# Patient Record
Sex: Male | Born: 1945 | Race: White | Hispanic: No | Smoking: Former smoker
Health system: Southern US, Community
[De-identification: ages and names within clinical notes are randomized; demographics above are authoritative.]

## PROBLEM LIST (undated history)

## (undated) DIAGNOSIS — I10 Essential (primary) hypertension: Secondary | ICD-10-CM

## (undated) DIAGNOSIS — T7840XA Allergy, unspecified, initial encounter: Secondary | ICD-10-CM

## (undated) DIAGNOSIS — G43909 Migraine, unspecified, not intractable, without status migrainosus: Secondary | ICD-10-CM

## (undated) DIAGNOSIS — E119 Type 2 diabetes mellitus without complications: Secondary | ICD-10-CM

## (undated) DIAGNOSIS — M1990 Unspecified osteoarthritis, unspecified site: Secondary | ICD-10-CM

## (undated) DIAGNOSIS — R739 Hyperglycemia, unspecified: Secondary | ICD-10-CM

## (undated) DIAGNOSIS — R7611 Nonspecific reaction to tuberculin skin test without active tuberculosis: Secondary | ICD-10-CM

## (undated) DIAGNOSIS — E785 Hyperlipidemia, unspecified: Secondary | ICD-10-CM

## (undated) HISTORY — PX: SHOULDER SURGERY: SHX246

## (undated) HISTORY — DX: Type 2 diabetes mellitus without complications: E11.9

## (undated) HISTORY — DX: Unspecified osteoarthritis, unspecified site: M19.90

## (undated) HISTORY — DX: Hyperglycemia, unspecified: R73.9

## (undated) HISTORY — PX: APPENDECTOMY: SHX54

## (undated) HISTORY — DX: Essential (primary) hypertension: I10

## (undated) HISTORY — PX: TONSILLECTOMY: SUR1361

## (undated) HISTORY — DX: Migraine, unspecified, not intractable, without status migrainosus: G43.909

## (undated) HISTORY — DX: Allergy, unspecified, initial encounter: T78.40XA

## (undated) HISTORY — DX: Hyperlipidemia, unspecified: E78.5

## (undated) HISTORY — DX: Nonspecific reaction to tuberculin skin test without active tuberculosis: R76.11

---

## 2001-03-18 ENCOUNTER — Observation Stay (HOSPITAL_COMMUNITY): Admission: EM | Admit: 2001-03-18 | Discharge: 2001-03-19 | Payer: Self-pay | Admitting: Emergency Medicine

## 2001-03-18 ENCOUNTER — Encounter: Payer: Self-pay | Admitting: Surgery

## 2016-08-18 ENCOUNTER — Encounter: Payer: Self-pay | Admitting: Family Medicine

## 2019-03-06 ENCOUNTER — Encounter: Payer: Self-pay | Admitting: Family Medicine

## 2019-03-06 ENCOUNTER — Ambulatory Visit (INDEPENDENT_AMBULATORY_CARE_PROVIDER_SITE_OTHER): Payer: Medicare Other | Admitting: Family Medicine

## 2019-03-06 ENCOUNTER — Other Ambulatory Visit: Payer: Self-pay

## 2019-03-06 VITALS — BP 116/66 | HR 66 | Temp 98.2°F | Ht 68.0 in | Wt 208.4 lb

## 2019-03-06 DIAGNOSIS — E785 Hyperlipidemia, unspecified: Secondary | ICD-10-CM | POA: Diagnosis not present

## 2019-03-06 DIAGNOSIS — J309 Allergic rhinitis, unspecified: Secondary | ICD-10-CM | POA: Insufficient documentation

## 2019-03-06 DIAGNOSIS — I10 Essential (primary) hypertension: Secondary | ICD-10-CM | POA: Diagnosis not present

## 2019-03-06 DIAGNOSIS — R739 Hyperglycemia, unspecified: Secondary | ICD-10-CM | POA: Diagnosis not present

## 2019-03-06 DIAGNOSIS — M7661 Achilles tendinitis, right leg: Secondary | ICD-10-CM

## 2019-03-06 DIAGNOSIS — Z6831 Body mass index (BMI) 31.0-31.9, adult: Secondary | ICD-10-CM

## 2019-03-06 DIAGNOSIS — E669 Obesity, unspecified: Secondary | ICD-10-CM

## 2019-03-06 DIAGNOSIS — E119 Type 2 diabetes mellitus without complications: Secondary | ICD-10-CM | POA: Insufficient documentation

## 2019-03-06 DIAGNOSIS — M1990 Unspecified osteoarthritis, unspecified site: Secondary | ICD-10-CM | POA: Insufficient documentation

## 2019-03-06 DIAGNOSIS — M7662 Achilles tendinitis, left leg: Secondary | ICD-10-CM

## 2019-03-06 LAB — LIPID PANEL
Cholesterol: 177 mg/dL (ref 0–200)
HDL: 50.9 mg/dL (ref >39.00)
LDL Cholesterol: 100 mg/dL — ABNORMAL HIGH (ref 0–99)
NonHDL: 125.6
Total CHOL/HDL Ratio: 3
Triglycerides: 129 mg/dL (ref 0.0–149.0)
VLDL: 25.8 mg/dL (ref 0.0–40.0)

## 2019-03-06 LAB — CBC
HCT: 41.3 % (ref 39.0–52.0)
Hemoglobin: 14.2 g/dL (ref 13.0–17.0)
MCHC: 34.5 g/dL (ref 30.0–36.0)
MCV: 98.4 fl (ref 78.0–100.0)
Platelets: 228 10*3/uL (ref 150.0–400.0)
RBC: 4.19 Mil/uL — ABNORMAL LOW (ref 4.22–5.81)
RDW: 13.3 % (ref 11.5–15.5)
WBC: 4.8 10*3/uL (ref 4.0–10.5)

## 2019-03-06 LAB — COMPREHENSIVE METABOLIC PANEL
ALT: 26 U/L (ref 0–53)
AST: 17 U/L (ref 0–37)
Albumin: 4.6 g/dL (ref 3.5–5.2)
Alkaline Phosphatase: 51 U/L (ref 39–117)
BUN: 16 mg/dL (ref 6–23)
CO2: 30 mEq/L (ref 19–32)
Calcium: 10.2 mg/dL (ref 8.4–10.5)
Chloride: 101 mEq/L (ref 96–112)
Creatinine, Ser: 1.11 mg/dL (ref 0.40–1.50)
GFR: 64.97 mL/min (ref >60.00)
Glucose, Bld: 153 mg/dL — ABNORMAL HIGH (ref 70–99)
Potassium: 4.1 mEq/L (ref 3.5–5.1)
Sodium: 139 mEq/L (ref 135–145)
Total Bilirubin: 0.6 mg/dL (ref 0.2–1.2)
Total Protein: 6.8 g/dL (ref 6.0–8.3)

## 2019-03-06 LAB — TSH: TSH: 1.79 u[IU]/mL (ref 0.35–4.50)

## 2019-03-06 LAB — HEMOGLOBIN A1C: Hgb A1c MFr Bld: 6.5 % (ref 4.6–6.5)

## 2019-03-06 MED ORDER — LISINOPRIL 20 MG PO TABS: 20.0000 mg | ORAL_TABLET | Freq: Every day | ORAL | 3 refills | Status: DC

## 2019-03-06 MED ORDER — CHLORTHALIDONE 25 MG PO TABS: 25.0000 mg | ORAL_TABLET | Freq: Every day | ORAL | 3 refills | Status: DC

## 2019-03-06 MED ORDER — AMLODIPINE BESYLATE 10 MG PO TABS: 10.0000 mg | ORAL_TABLET | Freq: Every day | ORAL | 3 refills | Status: DC

## 2019-03-06 MED ORDER — SIMVASTATIN 10 MG PO TABS: 10.0000 mg | ORAL_TABLET | Freq: Every day | ORAL | 3 refills | Status: DC

## 2019-03-06 MED ORDER — METOPROLOL SUCCINATE 50 MG PO CS24: 50.0000 mg | EXTENDED_RELEASE_CAPSULE | Freq: Every day | ORAL | 3 refills | Status: DC

## 2019-03-06 NOTE — Assessment & Plan Note (Signed)
At goal.  Continue amlodipine 10 mg daily, chlorthalidone 25 mg daily, lisinopril 20 mg daily, and metoprolol succinate 50 mg daily.  Check CBC, C met, and TSH.  Will place referral to cardiology to establish in the area.

## 2019-03-06 NOTE — Assessment & Plan Note (Signed)
Continue over-the-counter analgesics as needed.  Recommend Voltaren gel as needed.  Offered referral to orthopedics for pain management however he declined.  He will follow-up with me as needed.

## 2019-03-06 NOTE — Patient Instructions (Signed)
It was very nice to see you today!  I will refill your medications.   Please try voltaren gel for your ankle and other aches and pains.  We will check blood work today.  Come back in 1 year, or sooner if needed.   Take care, Dr Jerline Pain  Please try these tips to maintain a healthy lifestyle:   Eat at least 3 REAL meals and 1-2 snacks per day.  Aim for no more than 5 hours between eating.  If you eat breakfast, please do so within one hour of getting up.    Obtain twice as many fruits/vegetables as protein or carbohydrate foods for both lunch and dinner. (Half of each meal should be fruits/vegetables, one quarter protein, and one quarter starchy carbs)   Cut down on sweet beverages. This includes juice, soda, and sweet tea.    Exercise at least 150 minutes every week.

## 2019-03-06 NOTE — Assessment & Plan Note (Signed)
Continue simvastatin 10 mg daily.  Check lipid panel, CBC, C met, and TSH.

## 2019-03-06 NOTE — Assessment & Plan Note (Signed)
Continue Zyrtec as needed 

## 2019-03-06 NOTE — Progress Notes (Signed)
Chief Complaint:  Harold Moore is a 73 y.o. male who presents today with a chief complaint of essential hypertension and to establish care.   Assessment/Plan:  Osteoarthritis Continue over-the-counter analgesics as needed.  Recommend Voltaren gel as needed.  Offered referral to orthopedics for pain management however he declined.  He will follow-up with me as needed.  Allergic rhinitis Continue Zyrtec as needed.  Dyslipidemia Continue simvastatin 10 mg daily.  Check lipid panel, CBC, C met, and TSH.  Hyperglycemia Check A1c, CBC, C met, and TSH.  Essential hypertension At goal.  Continue amlodipine 10 mg daily, chlorthalidone 25 mg daily, lisinopril 20 mg daily, and metoprolol succinate 50 mg daily.  Check CBC, C met, and TSH.  Will place referral to cardiology to establish in the area.  Body mass index is 31.68 kg/m. / Overweight      Subjective:  HPI:  He has some pain in his posterior bilateral ankles.  Pain usually in the morning.  Improves throughout the day.  Sometimes has pain to the area as well.  His stable, chronic medical conditions are outlined below:   # Essential Hypertension - Has seen cardiology in the past in [place] for malignant hypertension in the past and needs to establish with a cardiologist in the area.  Reportedly had stress testing done a few years ago which was normal. - On amlodipine 54m daily, chlorthalidone 25 mg daily, lisinopril 20 mg daily, and metoprolol succinate 50 mg daily.  Tolerating all these well effects. - ROS: No reported chest pain or shortness of breath  # Dyslipidemia - On simvastatin 169mdaily and tolerating well - ROS: No reported myagias  # Allergic Rhinitis - Takes zyrtec 1048maily and tolerates well  # Osteoarthritis / Degenerative Disc Disease - Takes OTC analgesics as needed. Has seen pain management in the past.   ROS: Per HPI, otherwise a complete review of systems was negative.   PMH:  The following were  reviewed and entered/updated in epic: Past Medical History:  Diagnosis Date  . Allergy   . Arthritis   . Hyperlipidemia   . Hypertension   . Migraines   . Positive PPD    Patient Active Problem List   Diagnosis Date Noted  . Essential hypertension 03/06/2019  . Hyperglycemia 03/06/2019  . Dyslipidemia 03/06/2019  . Allergic rhinitis 03/06/2019  . Osteoarthritis 03/06/2019   Past Surgical History:  Procedure Laterality Date  . APPENDECTOMY    . SHOULDER SURGERY    . TONSILLECTOMY      Family History  Problem Relation Age of Onset  . Heart disease Mother   . Stroke Mother   . Heart disease Father   . Prostate cancer Father   . Heart disease Sister   . Thyroid cancer Maternal Grandmother   . Colon cancer Neg Hx     Medications- reviewed and updated Current Outpatient Medications  Medication Sig Dispense Refill  . amLODipine (NORVASC) 10 MG tablet Take 1 tablet (10 mg total) by mouth daily. 90 tablet 3  . aspirin EC 81 MG tablet Take 81 mg by mouth daily.    . cetirizine (ZYRTEC) 10 MG tablet Take 10 mg by mouth daily.    . chlorthalidone (HYGROTON) 25 MG tablet Take 1 tablet (25 mg total) by mouth daily. 90 tablet 3  . lisinopril (ZESTRIL) 20 MG tablet Take 1 tablet (20 mg total) by mouth daily. 90 tablet 3  . Metoprolol Succinate 50 MG CS24 Take 50 mg by mouth  daily. 90 capsule 3  . Probiotic Product (PROBIOTIC PO) Take by mouth.    . senna-docusate (SENOKOT-S) 8.6-50 MG tablet Take 1 tablet by mouth daily.    . simvastatin (ZOCOR) 10 MG tablet Take 1 tablet (10 mg total) by mouth daily. 90 tablet 3   No current facility-administered medications for this visit.     Allergies-reviewed and updated Allergies  Allergen Reactions  . Codeine Anaphylaxis    Social History   Socioeconomic History  . Marital status: Widowed    Spouse name: Not on file  . Number of children: Not on file  . Years of education: Not on file  . Highest education level: Not on file   Occupational History  . Not on file  Social Needs  . Financial resource strain: Not on file  . Food insecurity    Worry: Not on file    Inability: Not on file  . Transportation needs    Medical: Not on file    Non-medical: Not on file  Tobacco Use  . Smoking status: Former Smoker    Types: Cigarettes    Quit date: 03/05/1972    Years since quitting: 47.0  . Smokeless tobacco: Never Used  Substance and Sexual Activity  . Alcohol use: Yes  . Drug use: Never  . Sexual activity: Not on file  Lifestyle  . Physical activity    Days per week: Not on file    Minutes per session: Not on file  . Stress: Not on file  Relationships  . Social Herbalist on phone: Not on file    Gets together: Not on file    Attends religious service: Not on file    Active member of club or organization: Not on file    Attends meetings of clubs or organizations: Not on file    Relationship status: Not on file  Other Topics Concern  . Not on file  Social History Narrative  . Not on file        Objective:  Physical Exam: BP 116/66   Pulse 66   Temp 98.2 F (36.8 C) (Oral)   Ht _0  (1.727 m)   Wt 208 lb 6.1 oz (94.5 kg)   SpO2 99%   BMI 31.68 kg/m   Gen: NAD, resting comfortably CV: Regular rate and rhythm with no murmurs appreciated Pulm: Normal work of breathing, clear to auscultation bilaterally with no crackles, wheezes, or rhonchi GI: Normal bowel sounds present. Soft, Nontender, Nondistended. MSK: No edema, cyanosis, or clubbing noted Skin: Warm, dry Neuro: Grossly normal, moves all extremities Psych: Normal affect and thought content     Dekota Shenk M. Jerline Pain, MD 03/06/2019 10:18 AM

## 2019-03-06 NOTE — Assessment & Plan Note (Signed)
Check A1c, CBC, C met, and TSH. 

## 2019-03-07 ENCOUNTER — Other Ambulatory Visit: Payer: Self-pay

## 2019-03-07 DIAGNOSIS — R7303 Prediabetes: Secondary | ICD-10-CM

## 2019-03-07 MED ORDER — METFORMIN HCL ER 500 MG PO TB24: 500.0000 mg | ORAL_TABLET | Freq: Every day | ORAL | 3 refills | Status: DC

## 2019-03-07 NOTE — Progress Notes (Signed)
Please inform patient of the following:  Cholesterol levels are near goal - recommend continuing current dose of simvastatin. His blood sugar level is borderline diabetic. Recommend starting metformin 500mg  daily to lower blood sugar and reduce risk of heart attack and stroke. Regardless he should continue working on diet and exercise and we can recheck in 6-12 months.   All of his other labs are NORMAL.  Harold Moore. Jerline Pain, MD 03/07/2019 8:03 AM

## 2019-03-21 NOTE — Progress Notes (Signed)
Cardiology Office Note   Date:  03/22/2019   ID:  Harold Canneraul Mcgann, DOB December 24, 1945, MRN [id number]  PCP:  Ardith DarkParker, Caleb M, MD  Cardiologist:   No primary care provider on file. Referring:  Ardith DarkParker, Caleb M, MD  No chief complaint on file.     History of Present Illness: Harold Moore is a 73 y.o. male who is referred by Ardith DarkParker, Caleb M, MD for evaluation of difficult to control HTN.      Has seen cardiology in the past in [place] for malignant hypertension in the past and needs to establish with a cardiologist in the area.  Reportedly had stress testing done a few years ago which was normal.  I do not have records from the previous cardiology office other than to know that he has had carotid Dopplers and echocardiogram and what looks like an aortic ultrasound in the past.  He was told that something on the top of his heart was enlarged.  He denies any cardiovascular symptoms.  He golfs.  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  He denies any cardiovascular symptoms. The patient denies any new symptoms such as chest discomfort, neck or arm discomfort. There has been no new shortness of breath, PND or orthopnea. There have been no reported palpitations, presyncope or syncope.   Past Medical History:  Diagnosis Date  . Allergy   . Arthritis   . Hyperglycemia   . Hyperlipidemia   . Hypertension   . Migraines   . Positive PPD     Past Surgical History:  Procedure Laterality Date  . APPENDECTOMY    . SHOULDER SURGERY    . TONSILLECTOMY       Current Outpatient Medications  Medication Sig Dispense Refill  . amLODipine (NORVASC) 10 MG tablet Take 1 tablet (10 mg total) by mouth daily. 90 tablet 3  . aspirin EC 81 MG tablet Take 81 mg by mouth daily.    . cetirizine (ZYRTEC) 10 MG tablet Take 10 mg by mouth daily.    . chlorthalidone (HYGROTON) 25 MG tablet Take 1 tablet (25 mg total) by mouth daily. 90 tablet 3  . lisinopril (ZESTRIL) 20 MG tablet Take 1 tablet (20 mg  total) by mouth daily. 90 tablet 3  . metFORMIN (GLUCOPHAGE-XR) 500 MG 24 hr tablet Take 1 tablet (500 mg total) by mouth at bedtime. 90 tablet 3  . Metoprolol Succinate 50 MG CS24 Take 50 mg by mouth daily. 90 capsule 3  . Probiotic Product (PROBIOTIC PO) Take by mouth.    . senna-docusate (SENOKOT-S) 8.6-50 MG tablet Take 1 tablet by mouth daily.    . simvastatin (ZOCOR) 10 MG tablet Take 1 tablet (10 mg total) by mouth daily. 90 tablet 3   No current facility-administered medications for this visit.     Allergies:   Codeine    Social History:  The patient  reports that he quit smoking about 47 years ago. His smoking use included cigarettes. He has never used smokeless tobacco. He reports current alcohol use. He reports that he does not use drugs.   Family History:  The patient's family history includes Hearing loss in his brother, sister, and sister; Heart disease in his father, mother, and sister; Hyperlipidemia in his brother, mother, and sister; Hypertension in his mother and sister; Prostate cancer in his father; Stroke in his mother; Thyroid cancer in his maternal grandmother.    ROS:  Please see the history of present illness.  Otherwise, review of systems are positive for none.   All other systems are reviewed and negative.    PHYSICAL EXAM: VS:  BP 110/60   Pulse 65   Temp 98.2 F (36.8 C) (Temporal)   Ht 5\' 8"  (1.727 m)   Wt 208 lb (94.3 kg)   SpO2 98%   BMI 31.63 kg/m  , BMI Body mass index is 31.63 kg/m. GENERAL:  Well appearing HEENT:  Pupils equal round and reactive, fundi not visualized, oral mucosa unremarkable NECK:  No jugular venous distention, waveform within normal limits, carotid upstroke brisk and symmetric, no bruits, no thyromegaly LYMPHATICS:  No cervical, inguinal adenopathy LUNGS:  Clear to auscultation bilaterally BACK:  No CVA tenderness CHEST:  Unremarkable HEART:  PMI not displaced or sustained,S1 and S2 within normal limits, no S3, no S4, no  clicks, no rubs, no murmurs ABD:  Flat, positive bowel sounds normal in frequency in pitch, no bruits, no rebound, no guarding, no midline pulsatile mass, no hepatomegaly, no splenomegaly EXT:  2 plus pulses throughout, no edema, no cyanosis no clubbing SKIN:  No rashes no nodules NEURO:  Cranial nerves II through XII grossly intact, motor grossly intact throughout PSYCH:  Cognitively intact, oriented to person place and time    EKG:  EKG is ordered today. The ekg ordered today demonstrates sinus rhythm, rate 65, axis within normal limits, intervals within normal limits, no acute ST-T wave changes.   Recent Labs: 03/06/2019: ALT 26; BUN 16; Creatinine, Ser 1.11; Hemoglobin 14.2; Platelets 228.0; Potassium 4.1; Sodium 139; TSH 1.79    Lipid Panel    Component Value Date/Time   CHOL 177 03/06/2019 1014   TRIG 129.0 03/06/2019 1014   HDL 50.90 03/06/2019 1014   CHOLHDL 3 03/06/2019 1014   VLDL 25.8 03/06/2019 1014   LDLCALC 100 (H) 03/06/2019 1014      Wt Readings from Last 3 Encounters:  03/22/19 208 lb (94.3 kg)  03/06/19 208 lb 6.1 oz (94.5 kg)      Other studies Reviewed: Additional studies/ records that were reviewed today include: None. Review of the above records demonstrates:  Please see elsewhere in the note.     ASSESSMENT AND PLAN:  HTN:   Blood pressures well controlled on meds as listed.  He will continue.  DYSLIPIDEMIA: His LDL was 100 with an HDL of 50.  Continue meds as listed.  ABNORMAL CARDIOVASCULAR STUDY: I suspect he is describing aortic enlargement from the studies he has had been ordered and the message he was given but I do not have these data.  Again asked him to get me a copy of all of these scans and follow-up accordingly.  Current medicines are reviewed at length with the patient today.  The patient does not have concerns regarding medicines.  The following changes have been made:  no change  Labs/ tests ordered today include: None No  orders of the defined types were placed in this encounter.    Disposition:   FU with me in one year.     Signed, Minus Breeding, MD  03/22/2019 5:31 PM    [place]

## 2019-03-22 ENCOUNTER — Other Ambulatory Visit: Payer: Self-pay

## 2019-03-22 ENCOUNTER — Ambulatory Visit (INDEPENDENT_AMBULATORY_CARE_PROVIDER_SITE_OTHER): Payer: Medicare Other | Admitting: Cardiology

## 2019-03-22 ENCOUNTER — Encounter: Payer: Self-pay | Admitting: Cardiology

## 2019-03-22 VITALS — BP 110/60 | HR 65 | Temp 98.2°F | Ht 68.0 in | Wt 208.0 lb

## 2019-03-22 DIAGNOSIS — E785 Hyperlipidemia, unspecified: Secondary | ICD-10-CM

## 2019-03-22 DIAGNOSIS — I10 Essential (primary) hypertension: Secondary | ICD-10-CM | POA: Diagnosis not present

## 2019-03-22 DIAGNOSIS — R9430 Abnormal result of cardiovascular function study, unspecified: Secondary | ICD-10-CM | POA: Diagnosis not present

## 2019-03-22 NOTE — Patient Instructions (Signed)

## 2019-05-11 ENCOUNTER — Other Ambulatory Visit: Payer: Self-pay | Admitting: Family Medicine

## 2019-05-11 MED ORDER — METOPROLOL SUCCINATE 50 MG PO CS24: 50.0000 mg | EXTENDED_RELEASE_CAPSULE | Freq: Every day | ORAL | 3 refills | Status: DC

## 2019-05-11 NOTE — Telephone Encounter (Signed)
Medication Refill - Medication: Metoprolol Succinate 50 MG CS24  Pt stated [facility] did not receive rx for metoprolol and he is due for refill. Stated they told him they did not have that one for refill. Requesting rx to be resent. Please advise.  Has the patient contacted their pharmacy? Yes.   (Agent: If no, request that the patient contact the pharmacy for the refill.) (Agent: If yes, when and what did the pharmacy advise?)  Preferred Pharmacy (with phone number or street name):  [facility] [place] [facility] [phone] (Phone) [phone] (Fax)     Agent: Please be advised that RX refills may take up to 3 business days. We ask that you follow-up with your pharmacy.

## 2019-05-15 ENCOUNTER — Other Ambulatory Visit: Payer: Self-pay

## 2019-05-15 MED ORDER — METOPROLOL SUCCINATE 50 MG PO CS24: 50.0000 mg | EXTENDED_RELEASE_CAPSULE | Freq: Every day | ORAL | 3 refills | Status: DC

## 2019-05-17 ENCOUNTER — Other Ambulatory Visit: Payer: Self-pay

## 2019-05-17 MED ORDER — METOPROLOL SUCCINATE ER 50 MG PO TB24: 50.0000 mg | ORAL_TABLET | Freq: Every day | ORAL | 3 refills | Status: DC

## 2019-07-04 ENCOUNTER — Ambulatory Visit (INDEPENDENT_AMBULATORY_CARE_PROVIDER_SITE_OTHER): Payer: Medicare Other | Admitting: Physician Assistant

## 2019-07-04 ENCOUNTER — Encounter: Payer: Self-pay | Admitting: Physician Assistant

## 2019-07-04 ENCOUNTER — Other Ambulatory Visit: Payer: Self-pay

## 2019-07-04 VITALS — BP 118/60 | HR 70 | Temp 98.1°F | Ht 68.0 in | Wt 215.2 lb

## 2019-07-04 DIAGNOSIS — H5789 Other specified disorders of eye and adnexa: Secondary | ICD-10-CM | POA: Diagnosis not present

## 2019-07-04 MED ORDER — ERYTHROMYCIN 5 MG/GM OP OINT: 1.0000 "application " | TOPICAL_OINTMENT | Freq: Two times a day (BID) | OPHTHALMIC | 0 refills | Status: DC

## 2019-07-04 NOTE — Progress Notes (Signed)
Harold Moore is a 73 y.o. male here for a new problem.  I acted as a Neurosurgeon for [employer], PA-C Corky Mull, LPN  History of Present Illness:   Chief Complaint  Patient presents with  . Eye Problem    HPI   Eye problem Pt c/o pain in left eye, red, itching and woke up with drainage. Started with pressure above eyeball yesterday. Denies fever, chills, HA, loss of vision, eye floaters, sensation of black curtain coming down in field of vision, loss of peripheral vision, slurred speech, weakness.  Has seen an optometrist in the past year, went to [place], had normal vision exam. Wears glasses but didn't bring them with him today.    Past Medical History:  Diagnosis Date  . Allergy   . Arthritis   . Hyperglycemia   . Hyperlipidemia   . Hypertension   . Migraines   . Positive PPD      Social History   Socioeconomic History  . Marital status: Widowed    Spouse name: Not on file  . Number of children: Not on file  . Years of education: Not on file  . Highest education level: Not on file  Occupational History  . Not on file  Social Needs  . Financial resource strain: Not on file  . Food insecurity    Worry: Not on file    Inability: Not on file  . Transportation needs    Medical: Not on file    Non-medical: Not on file  Tobacco Use  . Smoking status: Former Smoker    Types: Cigarettes    Quit date: 03/05/1972    Years since quitting: 47.3  . Smokeless tobacco: Never Used  Substance and Sexual Activity  . Alcohol use: Yes  . Drug use: Never  . Sexual activity: Not on file  Lifestyle  . Physical activity    Days per week: Not on file    Minutes per session: Not on file  . Stress: Not on file  Relationships  . Social Musician on phone: Not on file    Gets together: Not on file    Attends religious service: Not on file    Active member of club or organization: Not on file    Attends meetings of clubs or organizations: Not on file   Relationship status: Not on file  . Intimate partner violence    Fear of current or ex partner: Not on file    Emotionally abused: Not on file    Physically abused: Not on file    Forced sexual activity: Not on file  Other Topics Concern  . Not on file  Social History Narrative  . Not on file    Past Surgical History:  Procedure Laterality Date  . APPENDECTOMY    . SHOULDER SURGERY    . TONSILLECTOMY      Family History  Problem Relation Age of Onset  . Heart disease Mother   . Stroke Mother   . Hyperlipidemia Mother   . Hypertension Mother   . Heart disease Father   . Prostate cancer Father   . Hearing loss Sister   . Hyperlipidemia Sister   . Hypertension Sister   . Heart disease Sister   . Thyroid cancer Maternal Grandmother   . Hearing loss Sister   . Hearing loss Brother   . Hyperlipidemia Brother   . Colon cancer Neg Hx     Allergies  Allergen Reactions  .  Codeine Anaphylaxis    Current Medications:   Current Outpatient Medications:  .  amLODipine (NORVASC) 10 MG tablet, Take 1 tablet (10 mg total) by mouth daily., Disp: 90 tablet, Rfl: 3 .  aspirin EC 81 MG tablet, Take 81 mg by mouth daily., Disp: , Rfl:  .  cetirizine (ZYRTEC) 10 MG tablet, Take 10 mg by mouth daily., Disp: , Rfl:  .  chlorthalidone (HYGROTON) 25 MG tablet, Take 1 tablet (25 mg total) by mouth daily., Disp: 90 tablet, Rfl: 3 .  lisinopril (ZESTRIL) 20 MG tablet, Take 1 tablet (20 mg total) by mouth daily., Disp: 90 tablet, Rfl: 3 .  metFORMIN (GLUCOPHAGE-XR) 500 MG 24 hr tablet, Take 1 tablet (500 mg total) by mouth at bedtime., Disp: 90 tablet, Rfl: 3 .  metoprolol succinate (TOPROL-XL) 50 MG 24 hr tablet, Take 1 tablet (50 mg total) by mouth daily., Disp: 90 tablet, Rfl: 3 .  Probiotic Product (PROBIOTIC PO), Take by mouth., Disp: , Rfl:  .  senna-docusate (SENOKOT-S) 8.6-50 MG tablet, Take 1 tablet by mouth daily., Disp: , Rfl:  .  simvastatin (ZOCOR) 10 MG tablet, Take 1 tablet (10  mg total) by mouth daily., Disp: 90 tablet, Rfl: 3 .  erythromycin ophthalmic ointment, Place 1 application into the left eye 2 (two) times daily., Disp: 3.5 g, Rfl: 0   Review of Systems:   ROS  Negative unless otherwise specified per HPI.  Vitals:   Vitals:   07/04/19 1302  BP: 118/60  Pulse: 70  Temp: 98.1 F (36.7 C)  TempSrc: Temporal  SpO2: 96%  Weight: 215 lb 4 oz (97.6 kg)  Height: 5\' 8"  (1.727 m)     Body mass index is 32.73 kg/m.  Physical Exam:   Physical Exam Vitals signs and nursing note reviewed.  Constitutional:      Appearance: He is well-developed.  HENT:     Head: Normocephalic.  Eyes:     General: Lids are normal. No visual field deficit.       Left eye: Discharge (watery) present.    Extraocular Movements: Extraocular movements intact.     Conjunctiva/sclera:     Left eye: Left conjunctiva is injected.     Pupils: Pupils are equal, round, and reactive to light.     Comments: Negative fluorescein uptake in L eye;   Neck:     Musculoskeletal: Normal range of motion.  Pulmonary:     Effort: Pulmonary effort is normal.  Musculoskeletal: Normal range of motion.  Skin:    General: Skin is warm and dry.  Neurological:     Mental Status: He is alert and oriented to person, place, and time.  Psychiatric:        Behavior: Behavior normal.        Thought Content: Thought content normal.        Judgment: Judgment normal.     Assessment and Plan:   Harold Moore was seen today for eye problem.  Diagnoses and all orders for this visit:  Irritation of left eye  Other orders -     erythromycin ophthalmic ointment; Place 1 application into the left eye 2 (two) times daily.    No red flags on exam. Will trial e-mycin ointment to cover for possible infection. Strict worsening precautions advised. Discussed that if he is no better, will refer to [facility] urgently Friday of this week. Patient verbalized understanding to plan.  . Reviewed expectations re:  course of current medical issues. . Discussed self-management  of symptoms. . Outlined signs and symptoms indicating need for more acute intervention. . Patient verbalized understanding and all questions were answered. . See orders for this visit as documented in the electronic medical record. . Patient received an After-Visit Summary.  CMA or LPN served as scribe during this visit. History, Physical, and Plan performed by medical provider. The above documentation has been reviewed and is accurate and complete.   Jarold MottoSamantha Jetaime Pinnix, PA-C

## 2019-07-04 NOTE — Patient Instructions (Signed)
It was great to see you!  Start the eye ointment.  If not better in a few days, let me know. We can get you into ophtho on Friday if needed. If any sudden vision loss -- please go to the ER.  Take care,  Inda Coke PA-C

## 2019-07-06 ENCOUNTER — Other Ambulatory Visit: Payer: Self-pay | Admitting: Physician Assistant

## 2019-07-06 ENCOUNTER — Telehealth: Payer: Self-pay | Admitting: Family Medicine

## 2019-07-06 DIAGNOSIS — H5789 Other specified disorders of eye and adnexa: Secondary | ICD-10-CM

## 2019-07-06 NOTE — Telephone Encounter (Signed)
Urgent referral placed. Harold Moore working on referral now and patient should be hearing from her shortly.

## 2019-07-06 NOTE — Telephone Encounter (Signed)
Please see message. °

## 2019-07-06 NOTE — Telephone Encounter (Signed)
Noted  

## 2019-07-06 NOTE — Telephone Encounter (Signed)
Pt called saying his eye was still bothering him. Wants to know if he can been seen again or see an eye doctor. Please advise.

## 2019-09-09 ENCOUNTER — Ambulatory Visit: Payer: Medicare Other | Attending: Internal Medicine

## 2019-09-09 DIAGNOSIS — Z23 Encounter for immunization: Secondary | ICD-10-CM | POA: Insufficient documentation

## 2019-09-09 NOTE — Progress Notes (Signed)
   Covid-19 Vaccination Clinic  Name:  Harold Moore    MRN: [id number] DOB: 1945/11/27  09/09/2019  Harold Moore was observed post Covid-19 immunization for 15 minutes without incidence. He was provided with Vaccine Information Sheet and instruction to access the V-Safe system.   Harold Moore was instructed to call 911 with any severe reactions post vaccine: Marland Kitchen Difficulty breathing  . Swelling of your face and throat  . A fast heartbeat  . A bad rash all over your body  . Dizziness and weakness    Immunizations Administered    Name Date Dose VIS Date Route   Pfizer COVID-19 Vaccine 09/09/2019 12:23 PM 0.3 mL 07/06/2019 Intramuscular   Manufacturer: [employer], [employer]   Lot: [id number]   NDC: [address]-9

## 2019-10-02 ENCOUNTER — Ambulatory Visit: Payer: Medicare Other | Attending: Internal Medicine

## 2019-10-02 DIAGNOSIS — Z23 Encounter for immunization: Secondary | ICD-10-CM | POA: Insufficient documentation

## 2019-10-02 NOTE — Progress Notes (Signed)
   Covid-19 Vaccination Clinic  Name:  Artavius Stearns    MRN: [id number] DOB: 04-09-1946  10/02/2019  Mr. Lacivita was observed post Covid-19 immunization for 15 minutes without incident. He was provided with Vaccine Information Sheet and instruction to access the V-Safe system.   Mr. Asare was instructed to call 911 with any severe reactions post vaccine: Marland Kitchen Difficulty breathing  . Swelling of face and throat  . A fast heartbeat  . A bad rash all over body  . Dizziness and weakness   Immunizations Administered    Name Date Dose VIS Date Route   Pfizer COVID-19 Vaccine 10/02/2019  5:14 PM 0.3 mL 07/06/2019 Intramuscular   Manufacturer: [employer], [employer]   Lot: [id number]   NDC: [address]-2

## 2019-10-17 ENCOUNTER — Telehealth: Payer: Self-pay | Admitting: Family Medicine

## 2019-10-17 NOTE — Telephone Encounter (Signed)
I left a message asking the patient to call and schedule Medicare AWV with Toni Amend [facility]) on 10/19/2019 after seeing Dr. Jimmey Ralph.  I'm waiting for a call back to either confirm or decline the appointment. If patient calls back, please update appointment notes.  VDM (Dee-Dee)

## 2019-10-19 ENCOUNTER — Encounter: Payer: Self-pay | Admitting: Family Medicine

## 2019-10-19 ENCOUNTER — Ambulatory Visit (INDEPENDENT_AMBULATORY_CARE_PROVIDER_SITE_OTHER): Payer: Medicare Other | Admitting: Family Medicine

## 2019-10-19 ENCOUNTER — Other Ambulatory Visit: Payer: Self-pay

## 2019-10-19 ENCOUNTER — Ambulatory Visit (INDEPENDENT_AMBULATORY_CARE_PROVIDER_SITE_OTHER): Payer: Medicare Other

## 2019-10-19 VITALS — BP 118/66 | HR 72 | Temp 97.5°F | Ht 68.0 in | Wt 215.2 lb

## 2019-10-19 VITALS — BP 118/66 | Temp 97.5°F | Ht 68.0 in | Wt 215.2 lb

## 2019-10-19 DIAGNOSIS — J309 Allergic rhinitis, unspecified: Secondary | ICD-10-CM

## 2019-10-19 DIAGNOSIS — R7303 Prediabetes: Secondary | ICD-10-CM | POA: Diagnosis not present

## 2019-10-19 DIAGNOSIS — Z0000 Encounter for general adult medical examination without abnormal findings: Secondary | ICD-10-CM | POA: Diagnosis not present

## 2019-10-19 DIAGNOSIS — R739 Hyperglycemia, unspecified: Secondary | ICD-10-CM | POA: Diagnosis not present

## 2019-10-19 DIAGNOSIS — M1990 Unspecified osteoarthritis, unspecified site: Secondary | ICD-10-CM

## 2019-10-19 DIAGNOSIS — I10 Essential (primary) hypertension: Secondary | ICD-10-CM

## 2019-10-19 LAB — POCT GLYCOSYLATED HEMOGLOBIN (HGB A1C): Hemoglobin A1C: 6.8 % — AB (ref 4.0–5.6)

## 2019-10-19 MED ORDER — TRAMADOL HCL 50 MG PO TABS: 50.0000 mg | ORAL_TABLET | Freq: Three times a day (TID) | ORAL | 0 refills | Status: AC | PRN

## 2019-10-19 MED ORDER — METFORMIN HCL ER 500 MG PO TB24: 1000.0000 mg | ORAL_TABLET | Freq: Every day | ORAL | 3 refills | Status: DC

## 2019-10-19 NOTE — Patient Instructions (Signed)
It was very nice to see you today!  Your blood sugar was went up slightly since our last visit.  Please increase your Metformin to 1000 mg daily.  I will send in a prescription for you.  I will also send in a prescription for tramadol and place referral for you to see the orthopedist.  Please come back in 6 months or so to recheck your blood sugar, or sooner if needed.  Take care, Dr Jimmey Ralph  Please try these tips to maintain a healthy lifestyle:   Eat at least 3 REAL meals and 1-2 snacks per day.  Aim for no more than 5 hours between eating.  If you eat breakfast, please do so within one hour of getting up.    Each meal should contain half fruits/vegetables, one quarter protein, and one quarter carbs (no bigger than a computer mouse)   Cut down on sweet beverages. This includes juice, soda, and sweet tea.     Drink at least 1 glass of water with each meal and aim for at least 8 glasses per day   Exercise at least 150 minutes every week.

## 2019-10-19 NOTE — Assessment & Plan Note (Signed)
A1c 6.8.  Discussed importance of lifestyle modifications.  Will increase Metformin to 1000 mg daily.  Follow-up 6 months to recheck A1c.

## 2019-10-19 NOTE — Assessment & Plan Note (Signed)
Will place referral to orthopedics.  Symptoms are worsening.  We will also send in a few tramadol as this is worked well for him in the past and he is tolerating well without side effects.

## 2019-10-19 NOTE — Patient Instructions (Addendum)
Harold Moore , Thank you for taking time to come for your Medicare Wellness Visit. I appreciate your ongoing commitment to your health goals. Please review the following plan we discussed and let me know if I can assist you in the future.   Screening recommendations/referrals: Colorectal Screening: recommended; Cologuard information provided   Vision and Dental Exams: Recommended annual ophthalmology exams for early detection of glaucoma and other disorders of the eye Recommended annual dental exams for proper oral hygiene  Vaccinations: Influenza vaccine: completed 05/01/19 Pneumococcal vaccine: recommended starting with Prevnar Tdap vaccine: recommended; Please call your insurance company to determine your out of pocket expense. You may receive this vaccine at your local pharmacy or Health Dept. Shingles vaccine: You may receive this vaccine at your local pharmacy. (see handout)   Advanced directives: Please bring a copy of your POA (Power of Attorney) and/or Living Will to your next appointment.  Goals: Recommend to drink at least 6-8 8oz glasses of water per day and consume a balanced diet rich in fresh fruits and vegetables.   Next appointment: Please schedule your Annual Wellness Visit with your Nurse Health Advisor in one year.  Preventive Care 74 Years and Older, Male Preventive care refers to lifestyle choices and visits with your health care provider that can promote health and wellness. What does preventive care include?  A yearly physical exam. This is also called an annual well check.  Dental exams once or twice a year.  Routine eye exams. Ask your health care provider how often you should have your eyes checked.  Personal lifestyle choices, including:  Daily care of your teeth and gums.  Regular physical activity.  Eating a healthy diet.  Avoiding tobacco and drug use.  Limiting alcohol use.  Practicing safe sex.  Taking low doses of aspirin every day if  recommended by your health care provider..  Taking vitamin and mineral supplements as recommended by your health care provider. What happens during an annual well check? The services and screenings done by your health care provider during your annual well check will depend on your age, overall health, lifestyle risk factors, and family history of disease. Counseling  Your health care provider may ask you questions about your:  Alcohol use.  Tobacco use.  Drug use.  Emotional well-being.  Home and relationship well-being.  Sexual activity.  Eating habits.  History of falls.  Memory and ability to understand (cognition).  Work and work Statistician. Screening  You may have the following tests or measurements:  Height, weight, and BMI.  Blood pressure.  Lipid and cholesterol levels. These may be checked every 5 years, or more frequently if you are over 14 years old.  Skin check.  Lung cancer screening. You may have this screening every year starting at age 34 if you have a 30-pack-year history of smoking and currently smoke or have quit within the past 15 years.  Fecal occult blood test (FOBT) of the stool. You may have this test every year starting at age 74.  Flexible sigmoidoscopy or colonoscopy. You may have a sigmoidoscopy every 5 years or a colonoscopy every 10 years starting at age 13.  Prostate cancer screening. Recommendations will vary depending on your family history and other risks.  Hepatitis C blood test.  Hepatitis B blood test.  Sexually transmitted disease (STD) testing.  Diabetes screening. This is done by checking your blood sugar (glucose) after you have not eaten for a while (fasting). You may have this done every 1-3  years.  Abdominal aortic aneurysm (AAA) screening. You may need this if you are a current or former smoker.  Osteoporosis. You may be screened starting at age 44 if you are at high risk. Talk with your health care provider about  your test results, treatment options, and if necessary, the need for more tests. Vaccines  Your health care provider may recommend certain vaccines, such as:  Influenza vaccine. This is recommended every year.  Tetanus, diphtheria, and acellular pertussis (Tdap, Td) vaccine. You may need a Td booster every 10 years.  Zoster vaccine. You may need this after age 74.  Pneumococcal 13-valent conjugate (PCV13) vaccine. One dose is recommended after age 74.  Pneumococcal polysaccharide (PPSV23) vaccine. One dose is recommended after age 74. Talk to your health care provider about which screenings and vaccines you need and how often you need them. This information is not intended to replace advice given to you by your health care provider. Make sure you discuss any questions you have with your health care provider. Document Released: 08/08/2015 Document Revised: 03/31/2016 Document Reviewed: 05/13/2015 Elsevier Interactive Patient Education  2017 [employer].  Fall Prevention in the Home Falls can cause injuries. They can happen to people of all ages. There are many things you can do to make your home safe and to help prevent falls. What can I do on the outside of my home?  Regularly fix the edges of walkways and driveways and fix any cracks.  Remove anything that might make you trip as you walk through a door, such as a raised step or threshold.  Trim any bushes or trees on the path to your home.  Use bright outdoor lighting.  Clear any walking paths of anything that might make someone trip, such as rocks or tools.  Regularly check to see if handrails are loose or broken. Make sure that both sides of any steps have handrails.  Any raised decks and porches should have guardrails on the edges.  Have any leaves, snow, or ice cleared regularly.  Use sand or salt on walking paths during winter.  Clean up any spills in your garage right away. This includes oil or grease spills. What  can I do in the bathroom?  Use night lights.  Install grab bars by the toilet and in the tub and shower. Do not use towel bars as grab bars.  Use non-skid mats or decals in the tub or shower.  If you need to sit down in the shower, use a plastic, non-slip stool.  Keep the floor dry. Clean up any water that spills on the floor as soon as it happens.  Remove soap buildup in the tub or shower regularly.  Attach bath mats securely with double-sided non-slip rug tape.  Do not have throw rugs and other things on the floor that can make you trip. What can I do in the bedroom?  Use night lights.  Make sure that you have a light by your bed that is easy to reach.  Do not use any sheets or blankets that are too big for your bed. They should not hang down onto the floor.  Have a firm chair that has side arms. You can use this for support while you get dressed.  Do not have throw rugs and other things on the floor that can make you trip. What can I do in the kitchen?  Clean up any spills right away.  Avoid walking on wet floors.  Keep items that  you use a lot in easy-to-reach places.  If you need to reach something above you, use a strong step stool that has a grab bar.  Keep electrical cords out of the way.  Do not use floor polish or wax that makes floors slippery. If you must use wax, use non-skid floor wax.  Do not have throw rugs and other things on the floor that can make you trip. What can I do with my stairs?  Do not leave any items on the stairs.  Make sure that there are handrails on both sides of the stairs and use them. Fix handrails that are broken or loose. Make sure that handrails are as long as the stairways.  Check any carpeting to make sure that it is firmly attached to the stairs. Fix any carpet that is loose or worn.  Avoid having throw rugs at the top or bottom of the stairs. If you do have throw rugs, attach them to the floor with carpet tape.  Make sure  that you have a light switch at the top of the stairs and the bottom of the stairs. If you do not have them, ask someone to add them for you. What else can I do to help prevent falls?  Wear shoes that:  Do not have high heels.  Have rubber bottoms.  Are comfortable and fit you well.  Are closed at the toe. Do not wear sandals.  If you use a stepladder:  Make sure that it is fully opened. Do not climb a closed stepladder.  Make sure that both sides of the stepladder are locked into place.  Ask someone to hold it for you, if possible.  Clearly mark and make sure that you can see:  Any grab bars or handrails.  First and last steps.  Where the edge of each step is.  Use tools that help you move around (mobility aids) if they are needed. These include:  Canes.  Walkers.  Scooters.  Crutches.  Turn on the lights when you go into a dark area. Replace any light bulbs as soon as they burn out.  Set up your furniture so you have a clear path. Avoid moving your furniture around.  If any of your floors are uneven, fix them.  If there are any pets around you, be aware of where they are.  Review your medicines with your doctor. Some medicines can make you feel dizzy. This can increase your chance of falling. Ask your doctor what other things that you can do to help prevent falls. This information is not intended to replace advice given to you by your health care provider. Make sure you discuss any questions you have with your health care provider. Document Released: 05/08/2009 Document Revised: 12/18/2015 Document Reviewed: 08/16/2014 Elsevier Interactive Patient Education  2017 [employer].

## 2019-10-19 NOTE — Assessment & Plan Note (Signed)
At goal.  Continue amlodipine 10 mg daily, chlorthalidone 25 mg daily, lisinopril 20 mg daily, and Toprol succinate 50 mg daily.

## 2019-10-19 NOTE — Assessment & Plan Note (Signed)
Stable.  Continue over-the-counter antihistamines as needed. 

## 2019-10-19 NOTE — Progress Notes (Signed)
   Harold Moore is a 74 y.o. male who presents today for an office visit.  Assessment/Plan:  Chronic Problems Addressed Today: Osteoarthritis Will place referral to orthopedics.  Symptoms are worsening.  We will also send in a few tramadol as this is worked well for him in the past and he is tolerating well without side effects.  Allergic rhinitis Stable.  Continue over-the-counter antihistamines as needed.  Hyperglycemia A1c 6.8.  Discussed importance of lifestyle modifications.  Will increase Metformin to 1000 mg daily.  Follow-up 6 months to recheck A1c.  Essential hypertension At goal.  Continue amlodipine 10 mg daily, chlorthalidone 25 mg daily, lisinopril 20 mg daily, and Toprol succinate 50 mg daily.     Subjective:  HPI:  See A/p.       Objective:  Physical Exam: BP 118/66   Pulse 72   Temp (!) 97.5 F (36.4 C)   Ht 5\' 8"  (1.727 m)   Wt 215 lb 3.2 oz (97.6 kg)   SpO2 98%   BMI 32.72 kg/m   Wt Readings from Last 3 Encounters:  10/19/19 215 lb 2.7 oz (97.6 kg)  10/19/19 215 lb 3.2 oz (97.6 kg)  07/04/19 215 lb 4 oz (97.6 kg)  Gen: No acute distress, resting comfortably CV: Regular rate and rhythm with no murmurs appreciated Pulm: Normal work of breathing, clear to auscultation bilaterally with no crackles, wheezes, or rhonchi Neuro: Grossly normal, moves all extremities Psych: Normal affect and thought content      Avrielle Fry M. 14/09/20, MD 10/19/2019 9:45 AM

## 2019-10-19 NOTE — Progress Notes (Signed)
Subjective:   Harold Moore is a 74 y.o. male who presents for an Initial Medicare Annual Wellness Visit.  Review of Systems   Cardiac Risk Factors include: advanced age (>64men, >102 women);hypertension;male gender;dyslipidemia   Objective:    Today's Vitals   10/19/19 0902  BP: 118/66  Temp: (!) 97.5 F (36.4 C)  TempSrc: Temporal  SpO2: 98%  Weight: 215 lb 2.7 oz (97.6 kg)  Height: 5\' 8"  (1.727 m)   Body mass index is 32.72 kg/m.  Advanced Directives 10/19/2019  Does Patient Have a Medical Advance Directive? Yes  Type of Advance Directive Living will;Healthcare Power of Attorney  Does patient want to make changes to medical advance directive? No - Patient declined  Copy of Healthcare Power of Attorney in Chart? No - copy requested    Current Medications (verified) Outpatient Encounter Medications as of 10/19/2019  Medication Sig  . amLODipine (NORVASC) 10 MG tablet Take 1 tablet (10 mg total) by mouth daily.  10/21/2019 aspirin EC 81 MG tablet Take 81 mg by mouth daily.  . cetirizine (ZYRTEC) 10 MG tablet Take 10 mg by mouth daily.  . chlorthalidone (HYGROTON) 25 MG tablet Take 1 tablet (25 mg total) by mouth daily.  Marland Kitchen erythromycin ophthalmic ointment Place 1 application into the left eye 2 (two) times daily.  Marland Kitchen lisinopril (ZESTRIL) 20 MG tablet Take 1 tablet (20 mg total) by mouth daily.  . metFORMIN (GLUCOPHAGE-XR) 500 MG 24 hr tablet Take 1 tablet (500 mg total) by mouth at bedtime.  . metoprolol succinate (TOPROL-XL) 50 MG 24 hr tablet Take 1 tablet (50 mg total) by mouth daily.  . Probiotic Product (PROBIOTIC PO) Take by mouth.  . senna-docusate (SENOKOT-S) 8.6-50 MG tablet Take 1 tablet by mouth daily.  . simvastatin (ZOCOR) 10 MG tablet Take 1 tablet (10 mg total) by mouth daily.   No facility-administered encounter medications on file as of 10/19/2019.    Allergies (verified) Codeine   History: Past Medical History:  Diagnosis Date  . Allergy   . Arthritis   .  Hyperglycemia   . Hyperlipidemia   . Hypertension   . Migraines   . Positive PPD    Past Surgical History:  Procedure Laterality Date  . APPENDECTOMY    . SHOULDER SURGERY    . TONSILLECTOMY     Family History  Problem Relation Age of Onset  . Heart disease Mother   . Stroke Mother   . Hyperlipidemia Mother   . Hypertension Mother   . Heart disease Father   . Prostate cancer Father   . Hearing loss Sister   . Hyperlipidemia Sister   . Hypertension Sister   . Heart disease Sister   . Thyroid cancer Maternal Grandmother   . Hearing loss Sister   . Hearing loss Brother   . Hyperlipidemia Brother   . Colon cancer Neg Hx    Social History   Socioeconomic History  . Marital status: Widowed    Spouse name: Not on file  . Number of children: Not on file  . Years of education: Not on file  . Highest education level: Not on file  Occupational History  . Not on file  Tobacco Use  . Smoking status: Former Smoker    Types: Cigarettes    Quit date: 03/05/1972    Years since quitting: 47.6  . Smokeless tobacco: Never Used  Substance and Sexual Activity  . Alcohol use: Yes  . Drug use: Never  . Sexual  activity: Not on file  Other Topics Concern  . Not on file  Social History Narrative  . Not on file   Social Determinants of Health   Financial Resource Strain:   . Difficulty of Paying Living Expenses:   Food Insecurity:   . Worried About Programme researcher, broadcasting/film/video in the Last Year:   . Barista in the Last Year:   Transportation Needs:   . Freight forwarder (Medical):   Marland Kitchen Lack of Transportation (Non-Medical):   Physical Activity:   . Days of Exercise per Week:   . Minutes of Exercise per Session:   Stress:   . Feeling of Stress :   Social Connections:   . Frequency of Communication with Friends and Family:   . Frequency of Social Gatherings with Friends and Family:   . Attends Religious Services:   . Active Member of Clubs or Organizations:   . Attends  Banker Meetings:   Marland Kitchen Marital Status:    Tobacco Counseling Counseling given: Not Answered   Clinical Intake:  Pre-visit preparation completed: Yes  Diabetes: No  How often do you need to have someone help you when you read instructions, pamphlets, or other written materials from your doctor or pharmacy?: 1 - Never  Interpreter Needed?: No  Information entered by :: Kandis Fantasia LPN  Activities of Daily Living In your present state of health, do you have any difficulty performing the following activities: 10/19/2019 10/19/2019  Hearing? N N  Vision? N N  Difficulty concentrating or making decisions? N N  Walking or climbing stairs? N N  Dressing or bathing? N N  Doing errands, shopping? N N  Preparing Food and eating ? - N  Using the Toilet? - N  In the past six months, have you accidently leaked urine? - N  Do you have problems with loss of bowel control? - N  Managing your Medications? - N  Managing your Finances? - N  Housekeeping or managing your Housekeeping? - N  Some recent data might be hidden     Immunizations and Health Maintenance Immunization History  Administered Date(s) Administered  . Influenza-Unspecified 05/01/2019  . PFIZER SARS-COV-2 Vaccination 09/09/2019, 10/02/2019   Health Maintenance Due  Topic Date Due  . Hepatitis C Screening  Never done    Patient Care Team: Ardith Dark, MD as PCP - General (Family Medicine)  Indicate any recent Medical Services you may have received from other than [facility] providers in the past year (date may be approximate).    Assessment:   This is a routine wellness examination for [place].  Hearing/Vision screen No exam data present  Dietary issues and exercise activities discussed: Current Exercise Habits: The patient does not participate in regular exercise at present  Goals   None    Depression Screen PHQ 2/9 Scores 10/19/2019 03/06/2019  PHQ - 2 Score 0 0    Fall Risk Fall Risk   10/19/2019 10/19/2019 03/06/2019  Falls in the past year? 0 0 1  Number falls in past yr: 0 - 0  Injury with Fall? 0 - 1  Comment - - Fell off ladder,hip injury  Follow up Falls evaluation completed;Education provided;Falls prevention discussed - -    Is the patient's home free of loose throw rugs in walkways, pet beds, electrical cords, etc?   yes      Grab bars in the bathroom? yes      Handrails on the stairs?   yes  Adequate lighting?   yes  Timed Get Up and Go performed: completed and within normal timeframe; no gait abnormalities noted   Cognitive Function:   6CIT Screen 10/19/2019  What Year? 0 points  What month? 0 points  What time? 0 points  Count back from 20 0 points  Months in reverse 0 points  Repeat phrase 0 points  Total Score 0    Screening Tests Health Maintenance  Topic Date Due  . Hepatitis C Screening  Never done  . COLONOSCOPY  10/18/2020 (Originally 05/23/1996)  . TETANUS/TDAP  10/18/2020 (Originally 05/23/1965)  . PNA vac Low Risk Adult (1 of 2 - PCV13) 10/18/2020 (Originally 05/24/2011)  . INFLUENZA VACCINE  Completed    Qualifies for Shingles Vaccine? Discussed and patient will check with pharmacy for coverage.  Patient education handout provided   Cancer Screenings: Lung: Low Dose CT Chest recommended if Age 85-80 years, 30 pack-year currently smoking OR have quit w/in 15years. Patient does not qualify. Colorectal: Cologuard information provided     Plan:  I have personally reviewed and addressed the Medicare Annual Wellness questionnaire and have noted the following in the patient's chart:  A. Medical and social history B. Use of alcohol, tobacco or illicit drugs  C. Current medications and supplements D. Functional ability and status E.  Nutritional status F.  Physical activity G. Advance directives H. List of other physicians I.  Hospitalizations, surgeries, and ER visits in previous 12 months J.  [place] such as  hearing and vision if needed, cognitive and depression L. Referrals, records requested, and appointments- none   In addition, I have reviewed and discussed with patient certain preventive protocols, quality metrics, and best practice recommendations. A written personalized care plan for preventive services as well as general preventive health recommendations were provided to patient.   Signed,  Denman George, LPN  Nurse Health Advisor   Nurse Notes: no additional

## 2019-10-24 ENCOUNTER — Encounter: Payer: Self-pay | Admitting: Physician Assistant

## 2019-10-24 ENCOUNTER — Ambulatory Visit (INDEPENDENT_AMBULATORY_CARE_PROVIDER_SITE_OTHER): Payer: Medicare Other | Admitting: Orthopaedic Surgery

## 2019-10-24 ENCOUNTER — Ambulatory Visit (INDEPENDENT_AMBULATORY_CARE_PROVIDER_SITE_OTHER): Payer: Medicare Other

## 2019-10-24 ENCOUNTER — Other Ambulatory Visit: Payer: Self-pay

## 2019-10-24 DIAGNOSIS — G8929 Other chronic pain: Secondary | ICD-10-CM

## 2019-10-24 DIAGNOSIS — M5416 Radiculopathy, lumbar region: Secondary | ICD-10-CM

## 2019-10-24 MED ORDER — METHOCARBAMOL 500 MG PO TABS: 500.0000 mg | ORAL_TABLET | Freq: Two times a day (BID) | ORAL | 0 refills | Status: DC | PRN

## 2019-10-24 MED ORDER — PREDNISONE 5 MG (21) PO TBPK: ORAL_TABLET | ORAL | 0 refills | Status: DC

## 2019-10-24 NOTE — Progress Notes (Signed)
Office Visit Note   Patient: Harold Moore           Date of Birth: [birth year]           MRN: [id number] Visit Date: 10/24/2019              Requested by: Ardith Dark, MD [address] [place],  [place] [address] PCP: Ardith Dark, MD   Assessment & Plan: Visit Diagnoses:  1. Chronic radicular pain of lower back     Plan: Impression is chronic bilateral lower back pain with right-sided lumbar radiculopathy.  I will start the patient on a steroid taper and muscle relaxer.  We will also get an updated MRI.  He will follow up with [place] after MRI  Follow-Up Instructions: Return if symptoms worsen or fail to improve.   Orders:  Orders Placed This Encounter  Procedures  . XR HIPS BILAT W OR W/O PELVIS 3-4 VIEWS  . XR Lumbar Spine 2-3 Views   Meds ordered this encounter  Medications  . predniSONE (STERAPRED UNI-PAK 21 TAB) 5 MG (21) TBPK tablet    Sig: Take as directed    Dispense:  21 tablet    Refill:  0  . methocarbamol (ROBAXIN) 500 MG tablet    Sig: Take 1 tablet (500 mg total) by mouth 2 (two) times daily as needed.    Dispense:  20 tablet    Refill:  0      Procedures: No procedures performed   Clinical Data: No additional findings.   Subjective: Chief Complaint  Patient presents with  . Right Hip - Pain  . Left Hip - Pain    HPI patient is a pleasant 74 year old gentleman who comes in today with bilateral hip pain.  This began approximately 5 to 6 years ago and has recently worsened.  The pain he has is to the entire lower back.  Radiates to the right lateral hip and down the right leg into the shin.  He also notes left lateral hip pain.  No pain to the anterior thigh or groin.  He does note occasional numbness and tingling to the right foot.  He does have a previous history of epidural steroid injections as well as trochanteric bursa injection to the left hip all with good relief of symptoms.  He denies any lower extremity weakness.  No new bowel or bladder  change or saddle paresthesias.  Review of Systems as detailed in HPI.  All others reviewed and are negative.   Objective: Vital Signs: There were no vitals taken for this visit.  Physical Exam well-developed well-nourished gentleman in no acute distress.  Alert and oriented x3.  Ortho Exam examination of the lumbar spine reveals no spinous tenderness.  He does have bilateral paraspinous musculature tenderness.  No pain with lumbar flexion or extension.  He has a moderately positive straight leg raise on the right.  Negative logroll and negative FADIR.  Negative straight leg raise on the left.  Negative logroll negative FADIR on the left.  Mild tenderness the greater trochanter on the left.  No focal weakness.  He is neurovascular intact distally.  Specialty Comments:  No specialty comments available.  Imaging: XR HIPS BILAT W OR W/O PELVIS 3-4 VIEWS  Result Date: 10/24/2019 Mild degenerative changes  XR Lumbar Spine 2-3 Views  Result Date: 10/24/2019 Moderate spondylosis worse L5-S1    PMFS History: Patient Active Problem List   Diagnosis Date Noted  . Essential hypertension 03/06/2019  .  Hyperglycemia 03/06/2019  . Dyslipidemia 03/06/2019  . Allergic rhinitis 03/06/2019  . Osteoarthritis 03/06/2019   Past Medical History:  Diagnosis Date  . Allergy   . Arthritis   . Hyperglycemia   . Hyperlipidemia   . Hypertension   . Migraines   . Positive PPD     Family History  Problem Relation Age of Onset  . Heart disease Mother   . Stroke Mother   . Hyperlipidemia Mother   . Hypertension Mother   . Heart disease Father   . Prostate cancer Father   . Hearing loss Sister   . Hyperlipidemia Sister   . Hypertension Sister   . Heart disease Sister   . Thyroid cancer Maternal Grandmother   . Hearing loss Sister   . Hearing loss Brother   . Hyperlipidemia Brother   . Colon cancer Neg Hx     Past Surgical History:  Procedure Laterality Date  . APPENDECTOMY    .  SHOULDER SURGERY    . TONSILLECTOMY     Social History   Occupational History  . Not on file  Tobacco Use  . Smoking status: Former Smoker    Types: Cigarettes    Quit date: 03/05/1972    Years since quitting: 47.6  . Smokeless tobacco: Never Used  Substance and Sexual Activity  . Alcohol use: Yes  . Drug use: Never  . Sexual activity: Not on file

## 2019-11-05 ENCOUNTER — Telehealth: Payer: Self-pay | Admitting: Physician Assistant

## 2019-11-05 ENCOUNTER — Other Ambulatory Visit: Payer: Self-pay

## 2019-11-05 MED ORDER — TRAMADOL HCL 50 MG PO TABS: 50.0000 mg | ORAL_TABLET | Freq: Three times a day (TID) | ORAL | 0 refills | Status: DC

## 2019-11-05 NOTE — Telephone Encounter (Signed)
RX called in to pharm. 

## 2019-11-05 NOTE — Telephone Encounter (Signed)
Called into pharm. Patient aware.  

## 2019-11-05 NOTE — Telephone Encounter (Signed)
Patient called. Says the pain meds and muscle relaxer's are not working. His call back number is [phone]

## 2019-11-05 NOTE — Telephone Encounter (Signed)
Patient called. He would like his medication sent to [facility] in [place] on [address]

## 2019-11-05 NOTE — Telephone Encounter (Signed)
I sent in a steroid taper and muscle relaxer I believe.  Can you call in tramadol 50mg  one tab po tid prn pain #30 no refill

## 2019-11-24 ENCOUNTER — Other Ambulatory Visit: Payer: Self-pay

## 2019-11-24 ENCOUNTER — Ambulatory Visit
Admission: RE | Admit: 2019-11-24 | Discharge: 2019-11-24 | Disposition: A | Discharge status: Home / Self Care | Payer: Medicare Other | Source: Ambulatory Visit | Attending: Physician Assistant | Admitting: Physician Assistant

## 2019-11-24 DIAGNOSIS — M5416 Radiculopathy, lumbar region: Secondary | ICD-10-CM

## 2019-11-24 DIAGNOSIS — G8929 Other chronic pain: Secondary | ICD-10-CM

## 2019-11-26 NOTE — Progress Notes (Signed)
F/u to discuss

## 2019-11-27 ENCOUNTER — Other Ambulatory Visit: Payer: Self-pay

## 2019-11-27 ENCOUNTER — Encounter: Payer: Self-pay | Admitting: Orthopaedic Surgery

## 2019-11-27 ENCOUNTER — Ambulatory Visit (INDEPENDENT_AMBULATORY_CARE_PROVIDER_SITE_OTHER): Payer: Medicare Other | Admitting: Orthopaedic Surgery

## 2019-11-27 DIAGNOSIS — M5416 Radiculopathy, lumbar region: Secondary | ICD-10-CM | POA: Diagnosis not present

## 2019-11-27 MED ORDER — TRAMADOL HCL 50 MG PO TABS: ORAL_TABLET | ORAL | 2 refills | Status: DC

## 2019-11-27 NOTE — Progress Notes (Signed)
Office Visit Note   Patient: Harold Moore           Date of Birth: 10-Feb-1946           MRN: [id number] Visit Date: 11/27/2019              Requested by: [name], MD [address] [place],  [place] [address] PCP: [name], MD   Assessment & Plan: Visit Diagnoses:  1. Radiculopathy, lumbar region     Plan: Impression is chronic bilateral lower back pain and lower extremity radiculopathy right greater than left.  MRI findings are consistent with his symptoms and we will at this point refer him to Dr. Ernestina Patches for [facility].  He will also follow-up with Dr. Lorin Mercy or [name] for further discussion for long-term treatment options as he understands that epidural steroid injections are temporary.  I have agreed to increase his tramadol for now.  Follow-Up Instructions: Return if symptoms worsen or fail to improve.   Orders:  Orders Placed This Encounter  Procedures  . Ambulatory referral to Physical Medicine Rehab   Meds ordered this encounter  Medications  . traMADol (ULTRAM) 50 MG tablet    Sig: Take 1-2 tabs po every 6-8 hours prn pain    Dispense:  60 tablet    Refill:  2      Procedures: No procedures performed   Clinical Data: No additional findings.   Subjective: Chief Complaint  Patient presents with  . Lower Back - Follow-up    MRI results    HPI patient is a 74 year old gentleman who comes in today to discuss MRI results of his lumbar spine.  He came in a few weeks ago with bilateral lower back pain and bilateral lower extremity radiculopathy right greater than left.  He has a history of multiple epidural steroid injections and failed physical therapy.  MRI of the lumbar spine was then ordered which showed moderate to severe bilateral neural foraminal narrowing at L3-4 and L4-5, right L4-5 foraminal protrusion abutting the exiting right L4 nerve root and mild L3 5 spinal canal narrowing.     Objective: Vital Signs: There were no vitals taken for this  visit.    Ortho Exam unchanged lumbar exam  Specialty Comments:  No specialty comments available.  Imaging: No new imaging   PMFS History: Patient Active Problem List   Diagnosis Date Noted  . Essential hypertension 03/06/2019  . Hyperglycemia 03/06/2019  . Dyslipidemia 03/06/2019  . Allergic rhinitis 03/06/2019  . Osteoarthritis 03/06/2019   Past Medical History:  Diagnosis Date  . Allergy   . Arthritis   . Hyperglycemia   . Hyperlipidemia   . Hypertension   . Migraines   . Positive PPD     Family History  Problem Relation Age of Onset  . Heart disease Mother   . Stroke Mother   . Hyperlipidemia Mother   . Hypertension Mother   . Heart disease Father   . Prostate cancer Father   . Hearing loss Sister   . Hyperlipidemia Sister   . Hypertension Sister   . Heart disease Sister   . Thyroid cancer Maternal Grandmother   . Hearing loss Sister   . Hearing loss Brother   . Hyperlipidemia Brother   . Colon cancer Neg Hx     Past Surgical History:  Procedure Laterality Date  . APPENDECTOMY    . SHOULDER SURGERY    . TONSILLECTOMY     Social History   Occupational  History  . Not on file  Tobacco Use  . Smoking status: Former Smoker    Types: Cigarettes    Quit date: 03/05/1972    Years since quitting: 47.7  . Smokeless tobacco: Never Used  Substance and Sexual Activity  . Alcohol use: Yes  . Drug use: Never  . Sexual activity: Not on file

## 2019-12-05 ENCOUNTER — Other Ambulatory Visit: Payer: Self-pay

## 2019-12-05 ENCOUNTER — Encounter: Payer: Self-pay | Admitting: Orthopaedic Surgery

## 2019-12-05 ENCOUNTER — Ambulatory Visit (INDEPENDENT_AMBULATORY_CARE_PROVIDER_SITE_OTHER): Payer: Medicare Other | Admitting: Orthopaedic Surgery

## 2019-12-05 DIAGNOSIS — M48061 Spinal stenosis, lumbar region without neurogenic claudication: Secondary | ICD-10-CM | POA: Diagnosis not present

## 2019-12-05 NOTE — Progress Notes (Signed)
Office Visit Note   Patient: Harold Moore           Date of Birth: 27-Jul-1945           MRN: [id number] Visit Date: 12/05/2019              Requested by: [name], MD [address] [place],  [place] [address] PCP: [name], MD   Assessment & Plan: Visit Diagnoses:  1. Lumbar foraminal stenosis     Plan: We will proceed with scheduled epidural recheck him in a month.  We discussed that if he gets good relief epidural then no further treatment would be necessary.  MRI scan was reviewed with him as well as diagnosis we discussed other options for foraminal stenosis including decompression with foraminotomy versus instrumented fusion and potential for progression at adjacent levels.  Office follow-up after epidural.  Follow-Up Instructions: Return in about 1 month (around 01/05/2020).   Orders:  No orders of the defined types were placed in this encounter.  No orders of the defined types were placed in this encounter.     Procedures: No procedures performed   Clinical Data: No additional findings.   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  His last injection lasted for more than a year.  He moved here to [place] where he has other family members and recently started having increasing back pain and right worse than left leg pain.  He is scheduled for an epidural injection on 12/19/2019 with Dr. Ernestina Patches.  He has been ambulatory with a cane he does not use the cane at the house he just recently got a walker and states he feels better using the walker.  He is used tramadol without relief.  Previously the epidurals usually give him relief for many months and sometimes he get a couple year.  He denies any associated bowel or bladder symptoms.  MRI scan 11/24/2019 showed moderate to severe neuroforaminal  stenosis at L3-4 and L4-5 with some mild central stenosis at L3-4.  Patient had mild disc bulge at L1-2 and L2-3.  Patient denies associated fever or chills.  Does have history of hyperglycemia hyperlipidemia and hypertension.  He has gotten some relief with prednisone Dosepak in the past but epidurals did much better.  Review of Systems systems are negative other than mentioned above.  Type 2 diabetes on Metformin.   Objective: Vital Signs: BP 129/78 (BP Location: Left Arm, Patient Position: Sitting, Cuff Size: Normal)    Pulse 86    Ht 5\' 8"  (1.727 m)    Wt 210 lb (95.3 kg)    BMI 31.93 kg/m   Physical Exam Constitutional:      Appearance: He is well-developed.  HENT:     Head: Normocephalic and atraumatic.  Eyes:     Pupils: Pupils are equal, round, and reactive to light.  Neck:     Thyroid: No thyromegaly.     Trachea: No tracheal deviation.  Cardiovascular:     Rate and Rhythm: Normal rate.  Pulmonary:     Effort: Pulmonary effort is normal.     Breath sounds: No wheezing.  Abdominal:     General: Bowel sounds are normal.     Palpations: Abdomen is soft.  Skin:    General: Skin is warm and dry.  Capillary Refill: Capillary refill takes less than 2 seconds.  Neurological:     Mental Status: He is alert and oriented to person, place, and time.  Psychiatric:        Behavior: Behavior normal.        Thought Content: Thought content normal.        Judgment: Judgment normal.     Ortho Exam patient can ambulate without a cane.  Anterior tib EHL gastrocsoleus is strong and intact.  Negative logroll to the hips.  Intact reflexes no calf atrophy no rash over exposed skin.  Negative straight leg raising 90 degrees negative popliteal compression test right and left.  Specialty Comments:  No specialty comments available.  Imaging: No results found.   PMFS History: Patient Active Problem List   Diagnosis Date Noted   Lumbar foraminal stenosis 12/05/2019   Essential  hypertension 03/06/2019   Hyperglycemia 03/06/2019   Dyslipidemia 03/06/2019   Allergic rhinitis 03/06/2019   Osteoarthritis 03/06/2019   Past Medical History:  Diagnosis Date   Allergy    Arthritis    Hyperglycemia    Hyperlipidemia    Hypertension    Migraines    Positive PPD     Family History  Problem Relation Age of Onset   Heart disease Mother    Stroke Mother    Hyperlipidemia Mother    Hypertension Mother    Heart disease Father    Prostate cancer Father    Hearing loss Sister    Hyperlipidemia Sister    Hypertension Sister    Heart disease Sister    Thyroid cancer Maternal Grandmother    Hearing loss Sister    Hearing loss Brother    Hyperlipidemia Brother    Colon cancer Neg Hx     Past Surgical History:  Procedure Laterality Date   APPENDECTOMY     SHOULDER SURGERY     TONSILLECTOMY     Social History   Occupational History   Not on file  Tobacco Use   Smoking status: Former Smoker    Types: Cigarettes    Quit date: 03/05/1972    Years since quitting: 47.7   Smokeless tobacco: Never Used  Substance and Sexual Activity   Alcohol use: Yes   Drug use: Never   Sexual activity: Not on file

## 2019-12-16 ENCOUNTER — Other Ambulatory Visit: Payer: Self-pay | Admitting: Family Medicine

## 2019-12-17 ENCOUNTER — Other Ambulatory Visit: Payer: Self-pay | Admitting: Physical Medicine and Rehabilitation

## 2019-12-17 ENCOUNTER — Telehealth: Payer: Self-pay | Admitting: Physical Medicine and Rehabilitation

## 2019-12-17 DIAGNOSIS — F411 Generalized anxiety disorder: Secondary | ICD-10-CM

## 2019-12-17 MED ORDER — DIAZEPAM 5 MG PO TABS: ORAL_TABLET | ORAL | 0 refills | Status: DC

## 2019-12-17 NOTE — Progress Notes (Signed)
Pre-procedure diazepam ordered for pre-operative anxiety.  

## 2019-12-17 NOTE — Telephone Encounter (Signed)
Patient is scheduled for ESI on 5/26 and would like Valium prior. Pharmacy is correct.

## 2019-12-17 NOTE — Telephone Encounter (Signed)
Done

## 2019-12-19 ENCOUNTER — Other Ambulatory Visit: Payer: Self-pay

## 2019-12-19 ENCOUNTER — Encounter: Payer: Self-pay | Admitting: Physical Medicine and Rehabilitation

## 2019-12-19 ENCOUNTER — Ambulatory Visit: Payer: Self-pay

## 2019-12-19 ENCOUNTER — Ambulatory Visit (INDEPENDENT_AMBULATORY_CARE_PROVIDER_SITE_OTHER): Payer: Medicare Other | Admitting: Physical Medicine and Rehabilitation

## 2019-12-19 VITALS — BP 115/72 | HR 92

## 2019-12-19 DIAGNOSIS — M5116 Intervertebral disc disorders with radiculopathy, lumbar region: Secondary | ICD-10-CM

## 2019-12-19 MED ORDER — METHYLPREDNISOLONE ACETATE 80 MG/ML IJ SUSP
40.0000 mg | Freq: Once | INTRAMUSCULAR | Status: AC
  Administered 2019-12-19: 40 mg

## 2019-12-19 NOTE — Progress Notes (Signed)
Numeric Pain Rating Scale and Functional Assessment Average Pain 8   In the last MONTH (on 0-10 scale) has pain interfered with the following?  1. General activity like being  able to carry out your everyday physical activities such as walking, climbing stairs, carrying groceries, or moving a chair?  Rating(10)   +Driver, -BT, -Dye Allergies. Pain on right side and down the right leg to the foot, some sxs on the left, onset 10/08/2019

## 2019-12-25 NOTE — Procedures (Signed)
Lumbosacral Transforaminal Epidural Steroid Injection - Sub-Pedicular Approach with Fluoroscopic Guidance  Patient: Harold Moore      Date of Birth: 1946-04-25 MRN: [id number] PCP: Ardith Dark, MD      Visit Date: 12/19/2019   Universal Protocol:    Date/Time: 12/19/2019  Consent Given By: the patient  Position: PRONE  Additional Comments: Vital signs were monitored before and after the procedure. Patient was prepped and draped in the usual sterile fashion. The correct patient, procedure, and site was verified.   Injection Procedure Details:  Procedure Site One Meds Administered:  Meds ordered this encounter  Medications  . methylPREDNISolone acetate (DEPO-MEDROL) injection 40 mg    Laterality: Right  Location/Site: Partially sacralized L5 segment with left abutting transverse process. L4-L5  Needle size: 22 G  Needle type: Spinal  Needle Placement: Transforaminal  Findings:    -Comments: Excellent flow of contrast along the nerve and into the epidural space.  Procedure Details: After squaring off the end-plates to get a true AP view, the C-arm was positioned so that an oblique view of the foramen as noted above was visualized. The target area is just inferior to the "nose of the scotty dog" or sub pedicular. The soft tissues overlying this structure were infiltrated with 2-3 ml. of 1% Lidocaine without Epinephrine.  The spinal needle was inserted toward the target using a "trajectory" view along the fluoroscope beam.  Under AP and lateral visualization, the needle was advanced so it did not puncture dura and was located close the 6 O'Clock position of the pedical in AP tracterory. Biplanar projections were used to confirm position. Aspiration was confirmed to be negative for CSF and/or blood. A 1-2 ml. volume of Isovue-250 was injected and flow of contrast was noted at each level. Radiographs were obtained for documentation purposes.   After attaining the desired  flow of contrast documented above, a 0.5 to 1.0 ml test dose of 0.25% Marcaine was injected into each respective transforaminal space.  The patient was observed for 90 seconds post injection.  After no sensory deficits were reported, and normal lower extremity motor function was noted,   the above injectate was administered so that equal amounts of the injectate were placed at each foramen (level) into the transforaminal epidural space.   Additional Comments:  The patient tolerated the procedure well Dressing: 2 x 2 sterile gauze and Band-Aid    Post-procedure details: Patient was observed during the procedure. Post-procedure instructions were reviewed.  Patient left the clinic in stable condition.

## 2019-12-25 NOTE — Progress Notes (Signed)
Harold Moore - 74 y.o. male MRN [id number]  Date of birth: 08-31-1945  Office Visit Note: Visit Date: 12/19/2019 PCP: [name], MD Referred by: [name], MD  Subjective: Chief Complaint  Patient presents with  . Lower Back - Pain   HPI:  Harold Moore is a 74 y.o. male who comes in today At the request of Dr. Eduard Roux for planned Right L4-L5 lumbar epidural steroid injection with fluoroscopic guidance.  The patient has failed conservative care including home exercise, medications, time and activity modification.  This injection will be diagnostic and hopefully therapeutic.  Please see requesting physician notes for further details and justification.  MRI reviewed with images and spine model.  MRI reviewed in the note below.  Not specifically noted on the MRI but patient appears to have partially sacralized L5 segment.   ROS Otherwise per HPI.  Assessment & Plan: Visit Diagnoses:  1. Radiculopathy due to lumbar intervertebral disc disorder     Plan: No additional findings.   Meds & Orders:  Meds ordered this encounter  Medications  . methylPREDNISolone acetate (DEPO-MEDROL) injection 40 mg    Orders Placed This Encounter  Procedures  . XR C-ARM NO REPORT  . Epidural Steroid injection    Follow-up: Return if symptoms worsen or fail to improve.   Procedures: No procedures performed  Lumbosacral Transforaminal Epidural Steroid Injection - Sub-Pedicular Approach with Fluoroscopic Guidance  Patient: Harold Moore      Date of Birth: 1945/07/31 MRN: [id number] PCP: [name], MD      Visit Date: 12/19/2019   Universal Protocol:    Date/Time: 12/19/2019  Consent Given By: the patient  Position: PRONE  Additional Comments: Vital signs were monitored before and after the procedure. Patient was prepped and draped in the usual sterile fashion. The correct patient, procedure, and site was verified.   Injection Procedure Details:  Procedure Site  One Meds Administered:  Meds ordered this encounter  Medications  . methylPREDNISolone acetate (DEPO-MEDROL) injection 40 mg    Laterality: Right  Location/Site: Partially sacralized L5 segment with left abutting transverse process. L4-L5  Needle size: 22 G  Needle type: Spinal  Needle Placement: Transforaminal  Findings:    -Comments: Excellent flow of contrast along the nerve and into the epidural space.  Procedure Details: After squaring off the end-plates to get a true AP view, the C-arm was positioned so that an oblique view of the foramen as noted above was visualized. The target area is just inferior to the "nose of the scotty dog" or sub pedicular. The soft tissues overlying this structure were infiltrated with 2-3 ml. of 1% Lidocaine without Epinephrine.  The spinal needle was inserted toward the target using a "trajectory" view along the fluoroscope beam.  Under AP and lateral visualization, the needle was advanced so it did not puncture dura and was located close the 6 O'Clock position of the pedical in AP tracterory. Biplanar projections were used to confirm position. Aspiration was confirmed to be negative for CSF and/or blood. A 1-2 ml. volume of Isovue-250 was injected and flow of contrast was noted at each level. Radiographs were obtained for documentation purposes.   After attaining the desired flow of contrast documented above, a 0.5 to 1.0 ml test dose of 0.25% Marcaine was injected into each respective transforaminal space.  The patient was observed for 90 seconds post injection.  After no sensory deficits were reported, and normal lower extremity motor function was noted,  the above injectate was administered so that equal amounts of the injectate were placed at each foramen (level) into the transforaminal epidural space.   Additional Comments:  The patient tolerated the procedure well Dressing: 2 x 2 sterile gauze and Band-Aid    Post-procedure  details: Patient was observed during the procedure. Post-procedure instructions were reviewed.  Patient left the clinic in stable condition.      Clinical History: MRI LUMBAR SPINE WITHOUT CONTRAST  TECHNIQUE: Multiplanar, multisequence MR imaging of the lumbar spine was performed. No intravenous contrast was administered.  COMPARISON:  10/24/2019 lumbar spine radiographs.  FINDINGS: Segmentation:  Normal.  Alignment:  Straightening of lumbar lordosis.  No listhesis.  Vertebrae:  No fracture, evidence of discitis, or bone lesion.  Conus medullaris and cauda equina: Conus extends to the L1 level. Conus and cauda equina appear normal.  Paraspinal and other soft tissues: Negative.  Disc levels:  Multilevel osteophytosis with bridging ossification of the anterior longitudinal ligament and Schmorl's node formation. Multilevel desiccation with mild disc space loss.  T12-L1: No significant disc bulge, spinal canal or neural foraminal narrowing.  L1-2: Disc bulge, ligamentum flavum and bilateral facet hypertrophy. Mild bilateral neural foraminal narrowing.  L2-3: Disc bulge, ligamentum flavum and bilateral facet hypertrophy. Mild bilateral neural foraminal narrowing.  L3-4: Disc bulge with superimposed central protrusion, ligamentum flavum and bilateral facet hypertrophy. Mild spinal canal and severe bilateral neural foraminal narrowing.  L4-5: Disc bulge with superimposed right foraminal protrusion/annular fissuring (7:25) abutting the exiting right L4 nerve root, ligamentum flavum and bilateral facet hypertrophy. Mild spinal canal, severe right and moderate left neural foraminal narrowing.  L5-S1: No significant disc bulge, spinal canal or neural foraminal narrowing.  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  Moderate to severe bilateral neural foraminal narrowing at the L3-4 and L4-5  levels.  Right L4-5 foraminal protrusion abutting the exiting right L4 nerve root.  Mild L3-5 spinal canal narrowing.   Electronically Signed   By: Stana Bunting M.D.   On: 11/24/2019 13:14     Objective:  VS:  HT:    WT:   BMI:     BP:115/72  HR:92bpm  TEMP: ( )  RESP:  Physical Exam   Imaging: No results found.

## 2019-12-26 ENCOUNTER — Telehealth: Payer: Self-pay | Admitting: Physical Medicine and Rehabilitation

## 2019-12-26 NOTE — Telephone Encounter (Signed)
Patient called. He would like an appointment with Dr. Alvester Morin. His call back number is [phone]

## 2019-12-27 NOTE — Telephone Encounter (Signed)
Ok for right L4 tf esi, at least two weeks from last

## 2019-12-27 NOTE — Telephone Encounter (Signed)
I called patient, and he states that he wants another set of injections. His last injection was 6 days ago. He states that he has had some relief from that and that his pain now is mostly in his right hip. Please advise.

## 2019-12-28 NOTE — Telephone Encounter (Signed)
Pt is scheduled with a driver 1/56/15

## 2020-01-08 ENCOUNTER — Ambulatory Visit: Payer: Medicare Other | Admitting: Orthopaedic Surgery

## 2020-01-21 ENCOUNTER — Ambulatory Visit: Payer: Self-pay

## 2020-01-21 ENCOUNTER — Ambulatory Visit (INDEPENDENT_AMBULATORY_CARE_PROVIDER_SITE_OTHER): Payer: Medicare Other | Admitting: Physical Medicine and Rehabilitation

## 2020-01-21 ENCOUNTER — Other Ambulatory Visit: Payer: Self-pay

## 2020-01-21 ENCOUNTER — Encounter: Payer: Self-pay | Admitting: Physical Medicine and Rehabilitation

## 2020-01-21 VITALS — BP 128/74 | HR 82

## 2020-01-21 DIAGNOSIS — M5116 Intervertebral disc disorders with radiculopathy, lumbar region: Secondary | ICD-10-CM | POA: Diagnosis not present

## 2020-01-21 DIAGNOSIS — M7061 Trochanteric bursitis, right hip: Secondary | ICD-10-CM

## 2020-01-21 DIAGNOSIS — M25551 Pain in right hip: Secondary | ICD-10-CM | POA: Diagnosis not present

## 2020-01-21 MED ORDER — METHYLPREDNISOLONE ACETATE 80 MG/ML IJ SUSP
80.0000 mg | Freq: Once | INTRAMUSCULAR | Status: AC
  Administered 2020-01-21: 80 mg

## 2020-01-21 MED ORDER — LIDOCAINE HCL 2 % IJ SOLN
4.0000 mL | INTRAMUSCULAR | Status: AC | PRN
  Administered 2020-01-21: 4 mL

## 2020-01-21 MED ORDER — BUPIVACAINE HCL 0.25 % IJ SOLN
4.0000 mL | INTRAMUSCULAR | Status: AC | PRN
  Administered 2020-01-21: 4 mL via INTRA_ARTICULAR

## 2020-01-21 MED ORDER — METHYLPREDNISOLONE ACETATE 40 MG/ML IJ SUSP
40.0000 mg | INTRAMUSCULAR | Status: AC | PRN
  Administered 2020-01-21: 40 mg via INTRA_ARTICULAR

## 2020-01-21 NOTE — Progress Notes (Signed)
Pt states pain in the lower back mostly on the right side that radiates into the right hip (no groin pain), and right leg all the way down with tingling in the right foot. Pt states last injection 12/19/19 helped some with right leg pain. Driving and walking makes pain worse. Ibuprofen and sitting helps with pain.   .Numeric Pain Rating Scale and Functional Assessment Average Pain 6   In the last MONTH (on 0-10 scale) has pain interfered with the following?  1. General activity like being  able to carry out your everyday physical activities such as walking, climbing stairs, carrying groceries, or moving a chair?  Rating(7)   +Driver, -BT, -Dye Allergies.

## 2020-01-21 NOTE — Procedures (Signed)
Lumbar Epidural Steroid Injection - Interlaminar Approach with Fluoroscopic Guidance  Patient: Harold Moore      Date of Birth: Feb 14, 1946 MRN: [id number] PCP: Ardith Dark, MD      Visit Date: 01/21/2020   Universal Protocol:     Consent Given By: the patient  Position: PRONE  Additional Comments: Vital signs were monitored before and after the procedure. Patient was prepped and draped in the usual sterile fashion. The correct patient, procedure, and site was verified.   Injection Procedure Details:  Procedure Site One Meds Administered:  Meds ordered this encounter  Medications  . methylPREDNISolone acetate (DEPO-MEDROL) injection 80 mg     Laterality: Right  Location/Site:  L4-L5  Needle size: 20 G  Needle type: Tuohy  Needle Placement: Paramedian epidural  Findings:   -Comments: Excellent flow of contrast into the epidural space.  Procedure Details: Using a paramedian approach from the side mentioned above, the region overlying the inferior lamina was localized under fluoroscopic visualization and the soft tissues overlying this structure were infiltrated with 4 ml. of 1% Lidocaine without Epinephrine. The Tuohy needle was inserted into the epidural space using a paramedian approach.   The epidural space was localized using loss of resistance along with lateral and bi-planar fluoroscopic views.  After negative aspirate for air, blood, and CSF, a 2 ml. volume of Isovue-250 was injected into the epidural space and the flow of contrast was observed. Radiographs were obtained for documentation purposes.    The injectate was administered into the level noted above.   Additional Comments:  The patient tolerated the procedure well Dressing: 2 x 2 sterile gauze and Band-Aid    Post-procedure details: Patient was observed during the procedure. Post-procedure instructions were reviewed.  Patient left the clinic in stable condition.

## 2020-01-21 NOTE — Progress Notes (Signed)
Harold Moore - 74 y.o. male MRN [id number]  Date of birth: 06/02/1946  Office Visit Note: Visit Date: 01/21/2020 PCP: Ardith Dark, MD Referred by: Ardith Dark, MD  Subjective: Chief Complaint  Patient presents with  . Lower Back - Pain  . Right Leg - Pain   HPI:  Harold Moore is a 74 y.o. male who comes in today for planned repeat Right L4-L5 Lumbar epidural steroid injection with fluoroscopic guidance.  The patient has failed conservative care including home exercise, medications, time and activity modification.  This injection will be diagnostic and hopefully therapeutic.  Please see requesting physician notes for further details and justification. Patient received more than 50% pain relief from prior injection.   Referring: Dr. Annell Greening  Also saw Dr. Glee Arvin.    Review of Systems  Musculoskeletal: Positive for back pain and joint pain.  Neurological: Positive for tingling.  All other systems reviewed and are negative.  Otherwise per HPI.  Assessment & Plan: Visit Diagnoses:  1. Radiculopathy due to lumbar intervertebral disc disorder   2. Greater trochanteric bursitis, right   3. Pain in right hip     Plan: Findings:  1.  Radicular type leg pain and more of an L5 distribution in the setting of MRI findings without severe narrowing or stenosis but more on the right at L4.  L4 transforaminal injection did give him some relief of his leg pain overall but just not where he wants to be.  Prior injections in [place] were more successful potentially from an interlaminar approach.  The patient is pretty aware of what was happening at the time and what type of injections were done.  He has had injections for many years.  We will try interlaminar approach today.  2.  A somewhat different issue is right hip pain that he does not really relate to the radicular pain itself.  He does get some pain with getting in and out of a car and laying on the right side.  He thinks this is  bursitis.  He has had injections in the bursa before.  He does have some pain with hip movement on exam today.  X-ray imaging from Dr. Roda Shutters shows no real great deal of arthritic changes of the hip.  Obviously MRI of the hip may be something that would show up better.  I did agree today to complete bursa injection diagnostically.  He will follow up with Dr. Glee Arvin or Dr. Annell Greening in terms of his hip.    Meds & Orders:  Meds ordered this encounter  Medications  . methylPREDNISolone acetate (DEPO-MEDROL) injection 80 mg    Orders Placed This Encounter  Procedures  . Large Joint Inj: R greater trochanter  . XR C-ARM NO REPORT  . Epidural Steroid injection    Follow-up: Return for visit to requesting physician as needed.   Procedures: Large Joint Inj: R greater trochanter with fluoroscopy on 01/21/2020 1:56 PM Indications: pain and diagnostic evaluation Details: 22 G 3.5 in needle, fluoroscopy-guided lateral approach  Arthrogram: No  Medications: 4 mL lidocaine 2 %; 4 mL bupivacaine 0.25 %; 40 mg methylPREDNISolone acetate 40 MG/ML Outcome: tolerated well, no immediate complications  Fluoroscopic guidance was utilized to position the needle tip along the right greater trochanter.  We did get excellent flow of contrast producing outline of the bursa. Procedure, treatment alternatives, risks and benefits explained, specific risks discussed. Consent was given by the patient. Immediately prior to procedure a time  out was called to verify the correct patient, procedure, equipment, support staff and site/side marked as required. Patient was prepped and draped in the usual sterile fashion.      Lumbar Epidural Steroid Injection - Interlaminar Approach with Fluoroscopic Guidance  Patient: Harold Moore      Date of Birth: 1945/12/19 MRN: [id number] PCP: Ardith Dark, MD      Visit Date: 01/21/2020   Universal Protocol:     Consent Given By: the patient  Position: PRONE  Additional  Comments: Vital signs were monitored before and after the procedure. Patient was prepped and draped in the usual sterile fashion. The correct patient, procedure, and site was verified.   Injection Procedure Details:  Procedure Site One Meds Administered:  Meds ordered this encounter  Medications  . methylPREDNISolone acetate (DEPO-MEDROL) injection 80 mg     Laterality: Right  Location/Site:  L4-L5  Needle size: 20 G  Needle type: Tuohy  Needle Placement: Paramedian epidural  Findings:   -Comments: Excellent flow of contrast into the epidural space.  Procedure Details: Using a paramedian approach from the side mentioned above, the region overlying the inferior lamina was localized under fluoroscopic visualization and the soft tissues overlying this structure were infiltrated with 4 ml. of 1% Lidocaine without Epinephrine. The Tuohy needle was inserted into the epidural space using a paramedian approach.   The epidural space was localized using loss of resistance along with lateral and bi-planar fluoroscopic views.  After negative aspirate for air, blood, and CSF, a 2 ml. volume of Isovue-250 was injected into the epidural space and the flow of contrast was observed. Radiographs were obtained for documentation purposes.    The injectate was administered into the level noted above.   Additional Comments:  The patient tolerated the procedure well Dressing: 2 x 2 sterile gauze and Band-Aid    Post-procedure details: Patient was observed during the procedure. Post-procedure instructions were reviewed.  Patient left the clinic in stable condition.    Clinical History: MRI LUMBAR SPINE WITHOUT CONTRAST  TECHNIQUE: Multiplanar, multisequence MR imaging of the lumbar spine was performed. No intravenous contrast was administered.  COMPARISON:  10/24/2019 lumbar spine radiographs.  FINDINGS: Segmentation:  Normal.  Alignment:  Straightening of lumbar lordosis.  No  listhesis.  Vertebrae:  No fracture, evidence of discitis, or bone lesion.  Conus medullaris and cauda equina: Conus extends to the L1 level. Conus and cauda equina appear normal.  Paraspinal and other soft tissues: Negative.  Disc levels:  Multilevel osteophytosis with bridging ossification of the anterior longitudinal ligament and Schmorl's node formation. Multilevel desiccation with mild disc space loss.  T12-L1: No significant disc bulge, spinal canal or neural foraminal narrowing.  L1-2: Disc bulge, ligamentum flavum and bilateral facet hypertrophy. Mild bilateral neural foraminal narrowing.  L2-3: Disc bulge, ligamentum flavum and bilateral facet hypertrophy. Mild bilateral neural foraminal narrowing.  L3-4: Disc bulge with superimposed central protrusion, ligamentum flavum and bilateral facet hypertrophy. Mild spinal canal and severe bilateral neural foraminal narrowing.  L4-5: Disc bulge with superimposed right foraminal protrusion/annular fissuring (7:25) abutting the exiting right L4 nerve root, ligamentum flavum and bilateral facet hypertrophy. Mild spinal canal, severe right and moderate left neural foraminal narrowing.  L5-S1: No significant disc bulge, spinal canal or neural foraminal narrowing.  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  Moderate to severe bilateral neural foraminal narrowing at the L3-4 and L4-5 levels.  Right L4-5 foraminal protrusion abutting the exiting right L4 nerve root.  Mild L3-5 spinal canal narrowing.   Electronically Signed   By: Primitivo Gauze M.D.   On: 11/24/2019 13:14     Objective:  VS:  HT:    WT:   BMI:     BP:128/74  HR:82bpm  TEMP: ( )  RESP:  Physical Exam Constitutional:      General: He is not in acute distress.    Appearance: Normal appearance. He is not ill-appearing.  HENT:     Head: Normocephalic and atraumatic.      Right Ear: External ear normal.     Left Ear: External ear normal.  Eyes:     Extraocular Movements: Extraocular movements intact.  Cardiovascular:     Rate and Rhythm: Normal rate.     Pulses: Normal pulses.  Abdominal:     General: There is no distension.     Palpations: Abdomen is soft.  Musculoskeletal:        General: No tenderness or signs of injury.     Right lower leg: No edema.     Left lower leg: No edema.     Comments: Patient has good distal strength without clonus. He has mild tenderness over the right greater trochanter. He does have some concordant pain with internal rotation of the right hip with some restricted motion compared to left.  He does have some lower back pain with extension and facet loading.  Skin:    Findings: No erythema or rash.  Neurological:     General: No focal deficit present.     Mental Status: He is alert and oriented to person, place, and time.     Sensory: No sensory deficit.     Motor: No weakness or abnormal muscle tone.     Coordination: Coordination normal.  Psychiatric:        Mood and Affect: Mood normal.        Behavior: Behavior normal.      Imaging: No results found.

## 2020-02-03 NOTE — Progress Notes (Signed)
Cardiology Office Note   Date:  02/04/2020   ID:  Harold Moore, DOB 1945-07-29, MRN [id number]  PCP:  Ardith Dark, MD  Cardiologist:   No primary care provider on file.   Chief Complaint  Patient presents with  . Abnormal CV Test      History of Present Illness: Harold Moore is a 74 y.o. male who is referred by Ardith Dark, MD for evaluation of difficult to control HTN.  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 I did not have records from the previous cardiology office other than to know that he has had carotid Dopplers and echocardiogram and what looks like an aortic ultrasound in the past.    We never did get the records from his primary provider or his cardiologist.  He was told he had an enlarged heart or possibly aorta.  There was also mention of "something collapsing up in his neck".  Since I last saw him he has done well.  The patient denies any new symptoms such as chest discomfort, neck or arm discomfort. There has been no new shortness of breath, PND or orthopnea. There have been no reported palpitations, presyncope or syncope.  He has been limited by back and knee pain.  He is getting injections.  Past Medical History:  Diagnosis Date  . Allergy   . Arthritis   . Hyperglycemia   . Hyperlipidemia   . Hypertension   . Migraines   . Positive PPD     Past Surgical History:  Procedure Laterality Date  . APPENDECTOMY    . SHOULDER SURGERY    . TONSILLECTOMY       Current Outpatient Medications  Medication Sig Dispense Refill  . amLODipine (NORVASC) 10 MG tablet TAKE 1 TABLET DAILY 90 tablet 0  . aspirin EC 81 MG tablet Take 81 mg by mouth daily.    . cetirizine (ZYRTEC) 10 MG tablet Take 10 mg by mouth daily.    . chlorthalidone (HYGROTON) 25 MG tablet TAKE 1 TABLET DAILY 90 tablet 0  . lisinopril (ZESTRIL) 20 MG tablet TAKE 1 TABLET DAILY 90 tablet 0  . metFORMIN (GLUCOPHAGE-XR) 500 MG 24 hr  tablet Take 2 tablets (1,000 mg total) by mouth at bedtime. 180 tablet 3  . metoprolol succinate (TOPROL-XL) 50 MG 24 hr tablet Take 1 tablet (50 mg total) by mouth daily. 90 tablet 3  . Probiotic Product (PROBIOTIC PO) Take by mouth.    . senna-docusate (SENOKOT-S) 8.6-50 MG tablet Take 1 tablet by mouth daily.    . simvastatin (ZOCOR) 10 MG tablet TAKE 1 TABLET DAILY 90 tablet 0   No current facility-administered medications for this visit.    Allergies:   Codeine    ROS:  Please see the history of present illness.   Otherwise, review of systems are positive for none.   All other systems are reviewed and negative.    PHYSICAL EXAM: VS:  BP 118/62   Pulse 68   Ht 5\' 8"  (1.727 m)   Wt 209 lb 6.4 oz (95 kg)   SpO2 97%   BMI 31.84 kg/m  , BMI Body mass index is 31.84 kg/m. GENERAL:  Well appearing NECK:  No jugular venous distention, waveform within normal limits, carotid upstroke brisk and symmetric, questionable bilateral carotid bruits, no thyromegaly LUNGS:  Clear to auscultation bilaterally CHEST:  Unremarkable HEART:  PMI not displaced or sustained,S1 and S2 within  normal limits, no S3, no S4, no clicks, no rubs, no murmurs ABD:  Flat, positive bowel sounds normal in frequency in pitch, no bruits, no rebound, no guarding, no midline pulsatile mass, no hepatomegaly, no splenomegaly EXT:  2 plus pulses throughout, no edema, no cyanosis no clubbing  EKG:  EKG is  ordered today. The ekg ordered today demonstrates sinus rhythm, rate 68, axis within normal limits, intervals within normal limits, no acute ST-T wave changes.   Recent Labs: 03/06/2019: ALT 26; BUN 16; Creatinine, Ser 1.11; Hemoglobin 14.2; Platelets 228.0; Potassium 4.1; Sodium 139; TSH 1.79    Lipid Panel    Component Value Date/Time   CHOL 177 03/06/2019 1014   TRIG 129.0 03/06/2019 1014   HDL 50.90 03/06/2019 1014   CHOLHDL 3 03/06/2019 1014   VLDL 25.8 03/06/2019 1014   LDLCALC 100 (H) 03/06/2019 1014       Wt Readings from Last 3 Encounters:  02/04/20 209 lb 6.4 oz (95 kg)  12/05/19 210 lb (95.3 kg)  10/19/19 215 lb 2.7 oz (97.6 kg)      Other studies Reviewed: Additional studies/ records that were reviewed today include: None. Review of the above records demonstrates:  NA  ASSESSMENT AND PLAN:  HTN:   Blood pressures are at target.  No change in therapy.   DYSLIPIDEMIA: His LDL was 100.  HDL is excellent at 51.  No change in therapy.   ABNORMAL CARDIOVASCULAR STUDY: I was unable to get any records from his primary provider.  He might be describing ascending aortic enlargement so I am going to go ahead and check an echocardiogram to evaluate for this or other chamber enlargement per his description.   BRUIT: I will check carotid Dopplers.  COVID EDUCATION:  He has had his vaccinations.  Current medicines are reviewed at length with the patient today.  The patient does not have concerns regarding medicines.  The following changes have been made:  no change  Labs/ tests ordered today include:    Orders Placed This Encounter  Procedures  . EKG 12-Lead  . ECHOCARDIOGRAM COMPLETE  . VAS US CAROTID     Disposition:   FU with me in one year.     Signed, Rollene Rotunda, MD  02/04/2020 11:32 AM    [facility]

## 2020-02-04 ENCOUNTER — Ambulatory Visit (INDEPENDENT_AMBULATORY_CARE_PROVIDER_SITE_OTHER): Payer: Medicare Other | Admitting: Cardiology

## 2020-02-04 ENCOUNTER — Encounter: Payer: Self-pay | Admitting: Cardiology

## 2020-02-04 ENCOUNTER — Other Ambulatory Visit: Payer: Self-pay

## 2020-02-04 VITALS — BP 118/62 | HR 68 | Ht 68.0 in | Wt 209.4 lb

## 2020-02-04 DIAGNOSIS — I7121 Aneurysm of the ascending aorta, without rupture: Secondary | ICD-10-CM

## 2020-02-04 DIAGNOSIS — Z7189 Other specified counseling: Secondary | ICD-10-CM | POA: Diagnosis not present

## 2020-02-04 DIAGNOSIS — I712 Thoracic aortic aneurysm, without rupture: Secondary | ICD-10-CM | POA: Diagnosis not present

## 2020-02-04 DIAGNOSIS — I10 Essential (primary) hypertension: Secondary | ICD-10-CM | POA: Diagnosis not present

## 2020-02-04 DIAGNOSIS — E785 Hyperlipidemia, unspecified: Secondary | ICD-10-CM | POA: Diagnosis not present

## 2020-02-04 DIAGNOSIS — R0989 Other specified symptoms and signs involving the circulatory and respiratory systems: Secondary | ICD-10-CM

## 2020-02-04 NOTE — Patient Instructions (Signed)
Medication Instructions:  The current medical regimen is effective;  continue present plan and medications.  *If you need a refill on your cardiac medications before your next appointment, please call your pharmacy*   Testing/Procedures: Your physician has requested that you have a carotid duplex. This test is an ultrasound of the carotid arteries in your neck. It looks at blood flow through these arteries that supply the brain with blood. Allow one hour for this exam. There are no restrictions or special instructions.  Echocardiogram - Your physician has requested that you have an echocardiogram. Echocardiography is a painless test that uses sound waves to create images of your heart. It provides your doctor with information about the size and shape of your heart and how well your heart's chambers and valves are working. This procedure takes approximately one hour. There are no restrictions for this procedure. This will be performed at our [facility] location - [address].    Follow-Up: At [facility], you and your health needs are our priority.  As part of our continuing mission to provide you with exceptional heart care, we have created designated Provider Care Teams.  These Care Teams include your primary Cardiologist (physician) and Advanced Practice Providers (APPs -  Physician Assistants and Nurse Practitioners) who all work together to provide you with the care you need, when you need it.  We recommend signing up for the patient portal called "MyChart".  Sign up information is provided on this After Visit Summary.  MyChart is used to connect with patients for Virtual Visits (Telemedicine).  Patients are able to view lab/test results, encounter notes, upcoming appointments, etc.  Non-urgent messages can be sent to your provider as well.   To learn more about what you can do with MyChart, go to [email or web address].    Your next appointment:   12 month(s)  The format  for your next appointment:   In Person  Provider:   Rollene Rotunda, MD

## 2020-02-19 ENCOUNTER — Ambulatory Visit (HOSPITAL_COMMUNITY): Payer: Medicare Other | Attending: Cardiology

## 2020-02-19 ENCOUNTER — Other Ambulatory Visit: Payer: Self-pay

## 2020-02-19 DIAGNOSIS — I712 Thoracic aortic aneurysm, without rupture: Secondary | ICD-10-CM | POA: Diagnosis present

## 2020-02-19 DIAGNOSIS — I7121 Aneurysm of the ascending aorta, without rupture: Secondary | ICD-10-CM

## 2020-02-19 LAB — ECHOCARDIOGRAM COMPLETE
Area-P 1/2: 3.46 cm2
S' Lateral: 2.7 cm

## 2020-02-27 ENCOUNTER — Ambulatory Visit (HOSPITAL_COMMUNITY)
Admission: RE | Admit: 2020-02-27 | Discharge: 2020-02-27 | Disposition: A | Discharge status: Home / Self Care | Payer: Medicare Other | Source: Ambulatory Visit | Attending: Cardiovascular Disease | Admitting: Cardiovascular Disease

## 2020-02-27 ENCOUNTER — Other Ambulatory Visit: Payer: Self-pay | Admitting: Family Medicine

## 2020-02-27 ENCOUNTER — Other Ambulatory Visit: Payer: Self-pay

## 2020-02-27 DIAGNOSIS — R0989 Other specified symptoms and signs involving the circulatory and respiratory systems: Secondary | ICD-10-CM

## 2020-02-28 ENCOUNTER — Other Ambulatory Visit: Payer: Self-pay | Admitting: Family Medicine

## 2020-03-07 ENCOUNTER — Other Ambulatory Visit: Payer: Self-pay | Admitting: Family Medicine

## 2020-05-10 ENCOUNTER — Other Ambulatory Visit: Payer: Self-pay | Admitting: Family Medicine

## 2020-05-21 ENCOUNTER — Other Ambulatory Visit: Payer: Self-pay | Admitting: Family Medicine

## 2020-07-23 ENCOUNTER — Other Ambulatory Visit: Payer: Self-pay | Admitting: Family Medicine

## 2020-07-24 ENCOUNTER — Other Ambulatory Visit: Payer: Self-pay | Admitting: Family Medicine

## 2020-08-01 ENCOUNTER — Other Ambulatory Visit: Payer: Self-pay | Admitting: Family Medicine

## 2020-09-02 ENCOUNTER — Other Ambulatory Visit: Payer: Self-pay

## 2020-09-02 ENCOUNTER — Ambulatory Visit (INDEPENDENT_AMBULATORY_CARE_PROVIDER_SITE_OTHER): Payer: Medicare Other | Admitting: Family Medicine

## 2020-09-02 ENCOUNTER — Encounter: Payer: Self-pay | Admitting: Family Medicine

## 2020-09-02 VITALS — BP 116/75 | HR 71 | Temp 97.7°F | Ht 68.0 in | Wt 216.8 lb

## 2020-09-02 DIAGNOSIS — I10 Essential (primary) hypertension: Secondary | ICD-10-CM

## 2020-09-02 DIAGNOSIS — R42 Dizziness and giddiness: Secondary | ICD-10-CM

## 2020-09-02 DIAGNOSIS — J309 Allergic rhinitis, unspecified: Secondary | ICD-10-CM | POA: Diagnosis not present

## 2020-09-02 NOTE — Patient Instructions (Signed)
It was very nice to see you today!  You have vertigo.  I will send you to a vestibular rehab physical therapist to teach you maneuvers to help improve this.  Please let me know if your symptoms worsen.  Take care, Dr Jimmey Ralph  Please try these tips to maintain a healthy lifestyle:   Eat at least 3 REAL meals and 1-2 snacks per day.  Aim for no more than 5 hours between eating.  If you eat breakfast, please do so within one hour of getting up.    Each meal should contain half fruits/vegetables, one quarter protein, and one quarter carbs (no bigger than a computer mouse)   Cut down on sweet beverages. This includes juice, soda, and sweet tea.     Drink at least 1 glass of water with each meal and aim for at least 8 glasses per day   Exercise at least 150 minutes every week.    Benign Positional Vertigo Vertigo is the feeling that you or your surroundings are moving when they are not. Benign positional vertigo is the most common form of vertigo. This is usually a harmless condition (benign). This condition is positional. This means that symptoms are triggered by certain movements and positions. This condition can be dangerous if it occurs while you are doing something that could cause harm to you or others. This includes activities such as driving or operating machinery. What are the causes? The inner ear has fluid-filled canals that help your brain sense movement and balance. When the fluid moves, the brain receives messages about your body's position. With benign positional vertigo, crystals in the inner ear break free and disturb the inner ear area. This causes your brain to receive confusing messages about your body's position. What increases the risk? You are more likely to develop this condition if:  You are a woman.  You are 98 years of age or older.  You have recently had a head injury.  You have an inner ear disease. What are the signs or symptoms? Symptoms of this  condition usually happen when you move your head or your eyes in different directions. Symptoms may start suddenly, and usually last for less than a minute. They include:  Loss of balance and falling.  Feeling like you are spinning or moving.  Feeling like your surroundings are spinning or moving.  Nausea and vomiting.  Blurred vision.  Dizziness.  Involuntary eye movement (nystagmus). Symptoms can be mild and cause only minor problems, or they can be severe and interfere with daily life. Episodes of benign positional vertigo may return (recur) over time. Symptoms may improve over time. How is this diagnosed? This condition may be diagnosed based on:  Your medical history.  Physical exam of the head, neck, and ears.  Positional tests to check for or stimulate vertigo. You may be asked to turn your head and change positions, such as going from sitting to lying down. A health care provider will watch for symptoms of vertigo. You may be referred to a health care provider who specializes in ear, nose, and throat problems (ENT, or otolaryngologist) or a provider who specializes in disorders of the nervous system (neurologist). How is this treated? This condition may be treated in a session in which your health care provider moves your head in specific positions to help the displaced crystals in your inner ear move. Treatment for this condition may take several sessions. Surgery may be needed in severe cases, but this is rare.  In some cases, benign positional vertigo may resolve on its own in 2-4 weeks.   Follow these instructions at home: Safety  Move slowly. Avoid sudden body or head movements or certain positions, as told by your health care provider.  Avoid driving until your health care provider says it is safe for you to do so.  Avoid operating heavy machinery until your health care provider says it is safe for you to do so.  Avoid doing any tasks that would be dangerous to you  or others if vertigo occurs.  If you have trouble walking or keeping your balance, try using a cane for stability. If you feel dizzy or unstable, sit down right away.  Return to your normal activities as told by your health care provider. Ask your health care provider what activities are safe for you. General instructions  Take over-the-counter and prescription medicines only as told by your health care provider.  Drink enough fluid to keep your urine pale yellow.  Keep all follow-up visits as told by your health care provider. This is important. Contact a health care provider if:  You have a fever.  Your condition gets worse or you develop new symptoms.  Your family or friends notice any behavioral changes.  You have nausea or vomiting that gets worse.  You have numbness or a prickling and tingling sensation. Get help right away if you:  Have difficulty speaking or moving.  Are always dizzy.  Faint.  Develop severe headaches.  Have weakness in your legs or arms.  Have changes in your hearing or vision.  Develop a stiff neck.  Develop sensitivity to light. Summary  Vertigo is the feeling that you or your surroundings are moving when they are not. Benign positional vertigo is the most common form of vertigo.  This condition is caused by crystals in the inner ear that become displaced. This causes a disturbance in an area of the inner ear that helps your brain sense movement and balance.  Symptoms include loss of balance and falling, feeling that you or your surroundings are moving, nausea and vomiting, and blurred vision.  This condition can be diagnosed based on symptoms, a physical exam, and positional tests.  Follow safety instructions as told by your health care provider. You will also be told when to contact your health care provider in case of problems. This information is not intended to replace advice given to you by your health care provider. Make sure you  discuss any questions you have with your health care provider. Document Revised: 06/05/2019 Document Reviewed: 12/21/2017 Elsevier Patient Education  2021 [employer].

## 2020-09-02 NOTE — Progress Notes (Signed)
   Harold Moore is a 75 y.o. male who presents today for an office visit.  Assessment/Plan:  New/Acute Problems: Vertigo No red flags history and exam consistent with BPPV.  Symptoms are currently manageable.  Will place referral to vestibular rehab.  He does not want meclizine at the time being.  Discussed reasons to return to care.  Chronic Problems Addressed Today: Essential hypertension At goal on amlodipine 10 mg daily, chlorthalidone 25 mg daily, lisinopril 20 mg daily, and metoprolol succinate 50 mg daily.  Orthostatic vital signs normal today.  Allergic rhinitis Well-controlled.  Doubt this is contributing to his vertigo.  He is on Zyrtec 10 mg daily.     Subjective:  HPI:  Patient here with intermittent dizziness for the last month or so.  Symptoms only occur when he is laying in bed on his right side.  Symptoms resolved when he lays flat on his back.  Symptoms have been getting worse recently.  Dizziness described as a room spinning sensation.  He has had noticed ears popping.  He has had tinnitus and hearing loss for years.  No recent changes.  No reported weakness or numbness.  No obvious aggravating or alleviating factors.       Objective:  Physical Exam: BP 116/75   Pulse 71   Temp 97.7 F (36.5 C) (Temporal)   Ht 5\' 8"  (1.727 m)   Wt 216 lb 12.8 oz (98.3 kg)   SpO2 98%   BMI 32.96 kg/m   Orthostatic VS for the past 72 hrs (Last 3 readings):  Orthostatic BP Patient Position Orthostatic Pulse  09/02/20 0931 128/79 Standing 76  09/02/20 0930 121/77 Sitting 82  09/02/20 0929 118/74 Supine 74  Gen: No acute distress, resting comfortably HEENT: Bilateral TMs clear. CV: Regular rate and rhythm with no murmurs appreciated Pulm: Normal work of breathing, clear to auscultation bilaterally with no crackles, wheezes, or rhonchi Neuro: Grossly normal, moves all extremities.  Finger-nose-finger intact bilaterally.  Cranial nerves II through XII intact.  Strength out of 5 in  upper and lower extremities.  Dix-Hallpike positive on the right Psych: Normal affect and thought content      Caleb M. 10/31/20, MD 09/02/2020 9:49 AM

## 2020-09-02 NOTE — Assessment & Plan Note (Signed)
Well-controlled.  Doubt this is contributing to his vertigo.  He is on Zyrtec 10 mg daily.

## 2020-09-02 NOTE — Assessment & Plan Note (Signed)
At goal on amlodipine 10 mg daily, chlorthalidone 25 mg daily, lisinopril 20 mg daily, and metoprolol succinate 50 mg daily.  Orthostatic vital signs normal today.

## 2020-09-10 ENCOUNTER — Other Ambulatory Visit: Payer: Self-pay

## 2020-09-10 ENCOUNTER — Ambulatory Visit (INDEPENDENT_AMBULATORY_CARE_PROVIDER_SITE_OTHER): Payer: Medicare Other | Admitting: Rehabilitative and Restorative Service Providers"

## 2020-09-10 ENCOUNTER — Encounter: Payer: Self-pay | Admitting: Rehabilitative and Restorative Service Providers"

## 2020-09-10 DIAGNOSIS — H8112 Benign paroxysmal vertigo, left ear: Secondary | ICD-10-CM

## 2020-09-10 NOTE — Therapy (Addendum)
[facility] [address] [place] [place], [place], [address] Phone: [phone]   Fax:  [phone]  Physical Therapy Evaluation and Discharge Summary  Patient Details  Name: Harold Moore MRN: [id number] Date of Birth: June 09, 1946 Referring Provider (PT): Jacquiline Doe, MD   Patient did not return to PT.  Please refer to initial evaluation for patient status. Thank you for the referral of this patient. Margretta Ditty, MPT    Encounter Date: 09/10/2020   PT End of Session - 09/10/20 1110    Visit Number 1    Number of Visits 4    Date for PT Re-Evaluation 10/10/20    Authorization Type humana    Authorization Time Period 2/16-3/18/22    PT Start Time 1017    PT Stop Time 1050    PT Time Calculation (min) 33 min           Past Medical History:  Diagnosis Date  . Allergy   . Arthritis   . Hyperglycemia   . Hyperlipidemia   . Hypertension   . Migraines   . Positive PPD     Past Surgical History:  Procedure Laterality Date  . APPENDECTOMY    . SHOULDER SURGERY    . TONSILLECTOMY      There were no vitals filed for this visit.    Subjective Assessment - 09/10/20 1020    Subjective The patient reports onset of dizziness one month ago when waking at night.  He noticing increased symptoms when rolling in bed that improve when lying on his back.  He also gets a foggy sensation at times.  He is avoiding fast turns after rising.    Pertinent History HTN, pre-diabetes, *the patient has a h/o of having prisms in his lenses noting a weak eye when young-- he got those updated last year    Patient Stated Goals Reduce vertigo at night.    Currently in Pain? Yes    Effect of Pain on Daily Activities h/o back and neck arthritis-- PT will monitor with testing for BPPV              [facility] PT Assessment - 09/10/20 1024      Assessment   Medical Diagnosis BPPV    Referring Provider (PT) Jacquiline Doe, MD      Balance  Screen   Has the patient fallen in the past 6 months No    Has the patient had a decrease in activity level because of a fear of falling?  No    Is the patient reluctant to leave their home because of a fear of falling?  No      Home Environment   Living Environment Private residence                  Vestibular Assessment - 09/10/20 1027      Vestibular Assessment   General Observation The patient walks indep into clinic without a device.  The patient gets an echoing sensation      Symptom Behavior   Subjective history of current problem Insidious onset    Type of Dizziness  Spinning    Frequency of Dizziness daily    Duration of Dizziness seconds    Symptom Nature Positional      Oculomotor Exam   Oculomotor Alignment Normal    Ocular ROM WFLs    Spontaneous Absent    Gaze-induced  Absent    Smooth Pursuits Intact    Saccades Intact  Comment NECK A/ROM significant limitations in extension      Vestibulo-Ocular Reflex   VOR 1 Head Only (x 1 viewing) 5 reps at slow pace is WNLs, with faster pace dyscoordination noted      Positional Testing   Dix-Hallpike Dix-Hallpike Right;Dix-Hallpike Left    Sidelying Test Sidelying Right;Sidelying Left    Horizontal Canal Testing Horizontal Canal Right;Horizontal Canal Left      Dix-Hallpike Right   Dix-Hallpike Right Duration none *The patient's neck position does not allow for extension below the horizontal plane.    Dix-Hallpike Right Symptoms No nystagmus      Dix-Hallpike Left   Dix-Hallpike Left Duration none    Dix-Hallpike Left Symptoms No nystagmus      Sidelying Right   Sidelying Right Duration none    Sidelying Right Symptoms No nystagmus      Sidelying Left   Sidelying Left Duration mild sensations "it's not normal" and some dizziness with return to sitting    Sidelying Left Symptoms Upbeat, left rotatory nystagmus      Horizontal Canal Right   Horizontal Canal Right Duration none    Horizontal Canal  Right Symptoms Normal      Horizontal Canal Left   Horizontal Canal Left Duration none    Horizontal Canal Left Symptoms Normal              Objective measurements completed on examination: See above findings.        Vestibular Treatment/Exercise - 09/10/20 1108      Vestibular Treatment/Exercise   Vestibular Treatment Provided Habituation;Gaze    Habituation Exercises Austin Miles    Gaze Exercises X1 Viewing Horizontal      Austin Miles   Number of Reps  3    Symptom Description  Chose habituation over Epley's maneuver due to cervical ROM limitations. Only noted symptoms on first rep to the left side      X1 Viewing Horizontal   Foot Position seated horizontal x 1 viewing    Comments verbal and demo cues to maintain focus on target                 PT Education - 09/10/20 1109    Education Details HEP    Person(s) Educated Patient    Methods Explanation;Demonstration;Handout    Comprehension Returned demonstration;Verbalized understanding               PT Long Term Goals - 09/10/20 1111      PT LONG TERM GOAL #1   Title The patient will be indep with HEP for habituation and gaze adaptation.    Time 4    Period Weeks    Target Date 10/10/20      PT LONG TERM GOAL #2   Title The patient will have negatie L sidelying test indicating resolution of BPPV.    Time 4    Period Weeks    Target Date 10/10/20                  Plan - 09/10/20 1112    Clinical Impression Statement The patient is a 75 yo male presenting to OP physical therapy with dizziness x 1 month noted when rolling over in bed.  At today's evaluation, he has a + L sidelying test indicating L posterior canalithiasis.  He also has some dyscoordination with performance of gaze adaptation/ VOR activities.  The patient's cervical spine A/ROM is significantly limited for extension and therefore Epley's maneuver not performed.  Instead,  PT educated in brandt daroff habituation for  postitional symptoms and gaze adaptation training.    Personal Factors and Comorbidities Comorbidity 1    Comorbidities HTN    Examination-Activity Limitations Sleep;Bed Mobility    Stability/Clinical Decision Making Stable/Uncomplicated    Clinical Decision Making Low    Rehab Potential Good    PT Frequency 1x / week    PT Duration 4 weeks    PT Treatment/Interventions Patient/family education;Vestibular;Canalith Repostioning;Neuromuscular re-education;Visual/perceptual remediation/compensation;Gait training;ADLs/Self Care Home Management    PT Next Visit Plan recheck positional symptoms, check progress with gaze adaptation    PT Home Exercise Plan Access Code: [email or web address]    Consulted and Agree with Plan of Care Patient           Patient will benefit from skilled therapeutic intervention in order to improve the following deficits and impairments:  Dizziness,Decreased balance  Visit Diagnosis: BPPV (benign paroxysmal positional vertigo), left     Problem List Patient Active Problem List   Diagnosis Date Noted  . 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 Essential hypertension 03/06/2019  . Hyperglycemia 03/06/2019  . Dyslipidemia 03/06/2019  . Allergic rhinitis 03/06/2019  . Osteoarthritis 03/06/2019    Mykiah Schmuck, PT 09/10/2020, 11:17 AM  [facility] [address] [place] [address] [place], [place], [address] Phone: [phone]   Fax:  [phone]  Name: Harold Moore MRN: [id number] Date of Birth: 05/02/1946

## 2020-09-10 NOTE — Patient Instructions (Signed)
Access Code: [email or web address] URL: [email or web address] Date: 09/10/2020 Prepared by: Margretta Ditty  Program Notes Do this sit<>sidelying exercise until you have 2 days without dizziness when performing.   Exercises Brandt-Daroff Vestibular Exercise - 2 x daily - 7 x weekly - 1 sets - 5 reps Seated Gaze Stabilization with Head Rotation - 2 x daily - 7 x weekly - 1 sets - 1 reps - 30 seconds hold

## 2020-09-19 ENCOUNTER — Encounter: Payer: Medicare Other | Admitting: Rehabilitative and Restorative Service Providers"

## 2020-10-04 ENCOUNTER — Other Ambulatory Visit: Payer: Self-pay | Admitting: Family Medicine

## 2020-10-16 ENCOUNTER — Other Ambulatory Visit: Payer: Self-pay | Admitting: Family Medicine

## 2020-11-05 ENCOUNTER — Other Ambulatory Visit: Payer: Self-pay | Admitting: Family Medicine

## 2020-11-05 DIAGNOSIS — R7303 Prediabetes: Secondary | ICD-10-CM

## 2020-11-14 ENCOUNTER — Ambulatory Visit: Payer: Medicare Other

## 2020-11-17 ENCOUNTER — Other Ambulatory Visit: Payer: Self-pay

## 2020-11-17 ENCOUNTER — Ambulatory Visit (INDEPENDENT_AMBULATORY_CARE_PROVIDER_SITE_OTHER): Payer: Medicare Other

## 2020-11-17 ENCOUNTER — Telehealth: Payer: Self-pay | Admitting: Physical Medicine and Rehabilitation

## 2020-11-17 VITALS — BP 124/70 | HR 90 | Temp 97.4°F | Wt 216.8 lb

## 2020-11-17 DIAGNOSIS — Z0000 Encounter for general adult medical examination without abnormal findings: Secondary | ICD-10-CM | POA: Diagnosis not present

## 2020-11-17 NOTE — Telephone Encounter (Signed)
Pt called asking for an appt for back injections he states he has seen Dr. Alvester Morin before.   [phone]

## 2020-11-17 NOTE — Patient Instructions (Addendum)
[name] , Thank you for taking time to come for your Medicare Wellness Visit. I appreciate your ongoing commitment to your health goals. Please review the following plan we discussed and let me know if I can assist you in the future.   Screening recommendations/referrals: Colonoscopy: No longer required per pt  Recommended yearly ophthalmology/optometry visit for glaucoma screening and checkup Recommended yearly dental visit for hygiene and checkup  Vaccinations: Influenza vaccine: Up to date Pneumococcal vaccine: Due and discussed Tdap vaccine: Due and discussed  Shingles vaccine: Completed 12/30/ 20 & 02/05/20 Covid-19: Completed 2/14, 3/9, & 06/25/20  Advanced directives: Please bring a copy of your health care power of attorney and living will to the office at your convenience.  Conditions/risks identified: Get rid of back pain   Next appointment: Follow up in one year for your annual wellness visit.   Preventive Care 90 Years and Older, Male Preventive care refers to lifestyle choices and visits with your health care provider that can promote health and wellness. What does preventive care include?  A yearly physical exam. This is also called an annual well check.  Dental exams once or twice a year.  Routine eye exams. Ask your health care provider how often you should have your eyes checked.  Personal lifestyle choices, including:  Daily care of your teeth and gums.  Regular physical activity.  Eating a healthy diet.  Avoiding tobacco and drug use.  Limiting alcohol use.  Practicing safe sex.  Taking low doses of aspirin every day.  Taking vitamin and mineral supplements as recommended by your health care provider. What happens during an annual well check? The services and screenings done by your health care provider during your annual well check will depend on your age, overall health, lifestyle risk factors, and family history of disease. Counseling  Your  health care provider may ask you questions about your:  Alcohol use.  Tobacco use.  Drug use.  Emotional well-being.  Home and relationship well-being.  Sexual activity.  Eating habits.  History of falls.  Memory and ability to understand (cognition).  Work and work Astronomer. Screening  You may have the following tests or measurements:  Height, weight, and BMI.  Blood pressure.  Lipid and cholesterol levels. These may be checked every 5 years, or more frequently if you are over 4 years old.  Skin check.  Lung cancer screening. You may have this screening every year starting at age 35 if you have a 30-pack-year history of smoking and currently smoke or have quit within the past 15 years.  Fecal occult blood test (FOBT) of the stool. You may have this test every year starting at age 43.  Flexible sigmoidoscopy or colonoscopy. You may have a sigmoidoscopy every 5 years or a colonoscopy every 10 years starting at age 74.  Prostate cancer screening. Recommendations will vary depending on your family history and other risks.  Hepatitis C blood test.  Hepatitis B blood test.  Sexually transmitted disease (STD) testing.  Diabetes screening. This is done by checking your blood sugar (glucose) after you have not eaten for a while (fasting). You may have this done every 1-3 years.  Abdominal aortic aneurysm (AAA) screening. You may need this if you are a current or former smoker.  Osteoporosis. You may be screened starting at age 74 if you are at high risk. Talk with your health care provider about your test results, treatment options, and if necessary, the need for more tests. Vaccines  Your health care provider may recommend certain vaccines, such as:  Influenza vaccine. This is recommended every year.  Tetanus, diphtheria, and acellular pertussis (Tdap, Td) vaccine. You may need a Td booster every 10 years.  Zoster vaccine. You may need this after age  74.  Pneumococcal 13-valent conjugate (PCV13) vaccine. One dose is recommended after age 19.  Pneumococcal polysaccharide (PPSV23) vaccine. One dose is recommended after age 2. Talk to your health care provider about which screenings and vaccines you need and how often you need them. This information is not intended to replace advice given to you by your health care provider. Make sure you discuss any questions you have with your health care provider. Document Released: 08/08/2015 Document Revised: 03/31/2016 Document Reviewed: 05/13/2015 Elsevier Interactive Patient Education  2017 [employer] [place] Prevention in the Home Falls can cause injuries. They can happen to people of all ages. There are many things you can do to make your home safe and to help prevent falls. What can I do on the outside of my home?  Regularly fix the edges of walkways and driveways and fix any cracks.  Remove anything that might make you trip as you walk through a door, such as a raised step or threshold.  Trim any bushes or trees on the path to your home.  Use bright outdoor lighting.  Clear any walking paths of anything that might make someone trip, such as rocks or tools.  Regularly check to see if handrails are loose or broken. Make sure that both sides of any steps have handrails.  Any raised decks and porches should have guardrails on the edges.  Have any leaves, snow, or ice cleared regularly.  Use sand or salt on walking paths during winter.  Clean up any spills in your garage right away. This includes oil or grease spills. What can I do in the bathroom?  Use night lights.  Install grab bars by the toilet and in the tub and shower. Do not use towel bars as grab bars.  Use non-skid mats or decals in the tub or shower.  If you need to sit down in the shower, use a plastic, non-slip stool.  Keep the floor dry. Clean up any water that spills on the floor as soon as it happens.  Remove  soap buildup in the tub or shower regularly.  Attach bath mats securely with double-sided non-slip rug tape.  Do not have throw rugs and other things on the floor that can make you trip. What can I do in the bedroom?  Use night lights.  Make sure that you have a light by your bed that is easy to reach.  Do not use any sheets or blankets that are too big for your bed. They should not hang down onto the floor.  Have a firm chair that has side arms. You can use this for support while you get dressed.  Do not have throw rugs and other things on the floor that can make you trip. What can I do in the kitchen?  Clean up any spills right away.  Avoid walking on wet floors.  Keep items that you use a lot in easy-to-reach places.  If you need to reach something above you, use a strong step stool that has a grab bar.  Keep electrical cords out of the way.  Do not use floor polish or wax that makes floors slippery. If you must use wax, use non-skid floor wax.  Do  not have throw rugs and other things on the floor that can make you trip. What can I do with my stairs?  Do not leave any items on the stairs.  Make sure that there are handrails on both sides of the stairs and use them. Fix handrails that are broken or loose. Make sure that handrails are as long as the stairways.  Check any carpeting to make sure that it is firmly attached to the stairs. Fix any carpet that is loose or worn.  Avoid having throw rugs at the top or bottom of the stairs. If you do have throw rugs, attach them to the floor with carpet tape.  Make sure that you have a light switch at the top of the stairs and the bottom of the stairs. If you do not have them, ask someone to add them for you. What else can I do to help prevent falls?  Wear shoes that:  Do not have high heels.  Have rubber bottoms.  Are comfortable and fit you well.  Are closed at the toe. Do not wear sandals.  If you use a  stepladder:  Make sure that it is fully opened. Do not climb a closed stepladder.  Make sure that both sides of the stepladder are locked into place.  Ask someone to hold it for you, if possible.  Clearly mark and make sure that you can see:  Any grab bars or handrails.  First and last steps.  Where the edge of each step is.  Use tools that help you move around (mobility aids) if they are needed. These include:  Canes.  Walkers.  Scooters.  Crutches.  Turn on the lights when you go into a dark area. Replace any light bulbs as soon as they burn out.  Set up your furniture so you have a clear path. Avoid moving your furniture around.  If any of your floors are uneven, fix them.  If there are any pets around you, be aware of where they are.  Review your medicines with your doctor. Some medicines can make you feel dizzy. This can increase your chance of falling. Ask your doctor what other things that you can do to help prevent falls. This information is not intended to replace advice given to you by your health care provider. Make sure you discuss any questions you have with your health care provider. Document Released: 05/08/2009 Document Revised: 12/18/2015 Document Reviewed: 08/16/2014 Elsevier Interactive Patient Education  2017 [employer].

## 2020-11-17 NOTE — Progress Notes (Signed)
Subjective:   Harold Moore is a 75 y.o. male who presents for Medicare Annual/Subsequent preventive examination.  Review of Systems     Cardiac Risk Factors include: advanced age (>52men, >69 women);hypertension;dyslipidemia;male gender;obesity (BMI >30kg/m2)     Objective:    Today's Vitals   11/17/20 0927 11/17/20 0928  BP:  124/70  Pulse:  90  Temp:  (!) 97.4 F (36.3 C)  SpO2:  95%  Weight:  216 lb 12.8 oz (98.3 kg)  PainSc: 5     Body mass index is 32.96 kg/m.  Advanced Directives 11/17/2020 10/19/2019  Does Patient Have a Medical Advance Directive? Yes Yes  Type of Advance Directive Healthcare Power of Attorney Living will;Healthcare Power of Attorney  Does patient want to make changes to medical advance directive? - No - Patient declined  Copy of Healthcare Power of Attorney in Chart? No - copy requested No - copy requested    Current Medications (verified) Outpatient Encounter Medications as of 11/17/2020  Medication Sig  . amLODipine (NORVASC) 10 MG tablet TAKE 1 TABLET DAILY  . aspirin EC 81 MG tablet Take 81 mg by mouth daily.  . cetirizine (ZYRTEC) 10 MG tablet Take 10 mg by mouth daily.  . chlorthalidone (HYGROTON) 25 MG tablet TAKE 1 TABLET DAILY  . lisinopril (ZESTRIL) 20 MG tablet TAKE 1 TABLET DAILY  . metFORMIN (GLUCOPHAGE-XR) 500 MG 24 hr tablet TAKE 2 TABLETS AT BEDTIME  . metoprolol succinate (TOPROL-XL) 50 MG 24 hr tablet TAKE 1 TABLET DAILY  . Probiotic Product (PROBIOTIC PO) Take by mouth.  . senna-docusate (SENOKOT-S) 8.6-50 MG tablet Take 1 tablet by mouth daily.  . simvastatin (ZOCOR) 10 MG tablet TAKE 1 TABLET DAILY   No facility-administered encounter medications on file as of 11/17/2020.    Allergies (verified) Codeine   History: Past Medical History:  Diagnosis Date  . Allergy   . Arthritis   . Hyperglycemia   . Hyperlipidemia   . Hypertension   . Migraines   . Positive PPD    Past Surgical History:  Procedure Laterality Date   . APPENDECTOMY    . SHOULDER SURGERY    . TONSILLECTOMY     Family History  Problem Relation Age of Onset  . Heart disease Mother   . Stroke Mother   . Hyperlipidemia Mother   . Hypertension Mother   . Heart disease Father   . Prostate cancer Father   . Hearing loss Sister   . Hyperlipidemia Sister   . Hypertension Sister   . Heart disease Sister   . Thyroid cancer Maternal Grandmother   . Hearing loss Sister   . Hearing loss Brother   . Hyperlipidemia Brother   . Colon cancer Neg Hx    Social History   Socioeconomic History  . Marital status: Widowed    Spouse name: Not on file  . Number of children: Not on file  . Years of education: Not on file  . Highest education level: Not on file  Occupational History  . Not on file  Tobacco Use  . Smoking status: Former Smoker    Types: Cigarettes    Quit date: 03/05/1972    Years since quitting: 48.7  . Smokeless tobacco: Never Used  Vaping Use  . Vaping Use: Never used  Substance and Sexual Activity  . Alcohol use: Yes  . Drug use: Never  . Sexual activity: Not on file  Other Topics Concern  . Not on file  Social History Narrative  .  Not on file   Social Determinants of Health   Financial Resource Strain: Low Risk   . Difficulty of Paying Living Expenses: Not hard at all  Food Insecurity: No Food Insecurity  . Worried About Programme researcher, broadcasting/film/video in the Last Year: Never true  . Ran Out of Food in the Last Year: Never true  Transportation Needs: No Transportation Needs  . Lack of Transportation (Medical): No  . Lack of Transportation (Non-Medical): No  Physical Activity: Insufficiently Active  . Days of Exercise per Week: 4 days  . Minutes of Exercise per Session: 30 min  Stress: No Stress Concern Present  . Feeling of Stress : Not at all  Social Connections: Socially Isolated  . Frequency of Communication with Friends and Family: More than three times a week  . Frequency of Social Gatherings with Friends and  Family: Twice a week  . Attends Religious Services: Never  . Active Member of Clubs or Organizations: No  . Attends Banker Meetings: Never  . Marital Status: Widowed    Tobacco Counseling Counseling given: Not Answered   Clinical Intake:  Pre-visit preparation completed: Yes  Pain : 0-10 Pain Score: 5  Pain Type: Chronic pain Pain Location: Back Pain Orientation: Lower Pain Descriptors / Indicators: Stabbing Pain Onset: More than a month ago Pain Frequency: Constant     BMI - recorded: 32.96 Nutritional Status: BMI > 30  Obese Nutritional Risks: None Diabetes: No  How often do you need to have someone help you when you read instructions, pamphlets, or other written materials from your doctor or pharmacy?: 1 - Never  Diabetic?No  Interpreter Needed?: No  Information entered by :: Lanier Ensign, LPN   Activities of Daily Living In your present state of health, do you have any difficulty performing the following activities: 11/17/2020  Hearing? Y  Comment vertigo and left  ear popping at times  Vision? N  Difficulty concentrating or making decisions? Y  Walking or climbing stairs? Y  Dressing or bathing? Y  Comment hard to bend and put socks on  Doing errands, shopping? N  Preparing Food and eating ? N  Using the Toilet? N  In the past six months, have you accidently leaked urine? N  Do you have problems with loss of bowel control? N  Managing your Medications? N  Managing your Finances? N  Housekeeping or managing your Housekeeping? N  Some recent data might be hidden    Patient Care Team: Ardith Dark, MD as PCP - General (Family Medicine)  Indicate any recent Medical Services you may have received from other than [facility] providers in the past year (date may be approximate).     Assessment:   This is a routine wellness examination for [place].  Hearing/Vision screen  Hearing Screening   125Hz  250Hz  500Hz  1000Hz  2000Hz  3000Hz  4000Hz   6000Hz  8000Hz   Right ear:           Left ear:           Comments: Left ear popping and have periods of vertigo  Vision Screening Comments: Pt follows up with [facility] annually for eye exams   Dietary issues and exercise activities discussed: Current Exercise Habits: Home exercise routine, Type of exercise: walking, Time (Minutes): 30, Frequency (Times/Week): 4, Weekly Exercise (Minutes/Week): 120  Goals    . Patient Stated     Get rid of back pain       Depression Screen [facility] 2/9 Scores 11/17/2020 10/19/2019 03/06/2019  PHQ - 2 Score 0 0 0    Fall Risk Fall Risk  11/17/2020 10/19/2019 10/19/2019 03/06/2019  Falls in the past year? 0 0 0 1  Number falls in past yr: 0 0 - 0  Injury with Fall? 0 0 - 1  Comment - - - Fell off ladder,hip injury  Risk for fall due to : Impaired mobility;Impaired balance/gait;Impaired vision - - -  Risk for fall due to: Comment related to vertigo at times - - -  Follow up Falls prevention discussed Falls evaluation completed;Education provided;Falls prevention discussed - -    FALL RISK PREVENTION PERTAINING TO THE HOME:  Any stairs in or around the home? No  If so, are there any without handrails? No  Home free of loose throw rugs in walkways, pet beds, electrical cords, etc? Yes  Adequate lighting in your home to reduce risk of falls? Yes   ASSISTIVE DEVICES UTILIZED TO PREVENT FALLS:  Life alert? No  Use of a cane, walker or w/c? No  Grab bars in the bathroom? No  Shower chair or bench in shower? Yes Elevated toilet seat or a handicapped toilet? No   TIMED UP AND GO:  Was the test performed? Yes .  Length of time to ambulate 10 feet: 10 sec.   Gait steady and fast without use of assistive device  Cognitive Function:     6CIT Screen 11/17/2020 10/19/2019  What Year? 0 points 0 points  What month? 0 points 0 points  What time? - 0 points  Count back from 20 0 points 0 points  Months in reverse 0 points 0 points  Repeat phrase 0 points 0  points  Total Score - 0    Immunizations Immunization History  Administered Date(s) Administered  . Influenza-Unspecified 05/01/2019, 05/12/2020  . PFIZER(Purple Top)SARS-COV-2 Vaccination 09/09/2019, 10/02/2019, 06/25/2020  . Zoster Recombinat (Shingrix) 07/25/2019, 02/05/2020    TDAP status: Due, Education has been provided regarding the importance of this vaccine. Advised may receive this vaccine at local pharmacy or Health Dept. Aware to provide a copy of the vaccination record if obtained from local pharmacy or Health Dept. Verbalized acceptance and understanding.  Flu Vaccine status: Up to date  Pneumococcal vaccine status: Due, Education has been provided regarding the importance of this vaccine. Advised may receive this vaccine at local pharmacy or Health Dept. Aware to provide a copy of the vaccination record if obtained from local pharmacy or Health Dept. Verbalized acceptance and understanding.  Covid-19 vaccine status: Completed vaccines  Qualifies for Shingles Vaccine? Yes   Zostavax completed Yes   Shingrix Completed?: Yes  Screening Tests Health Maintenance  Topic Date Due  . Hepatitis C Screening  Never done  . TETANUS/TDAP  Never done  . PNA vac Low Risk Adult (1 of 2 - PCV13) Never done  . COLONOSCOPY (Pts 45-7021yrs Insurance coverage will need to be confirmed)  11/17/2021 (Originally 05/24/1991)  . INFLUENZA VACCINE  02/23/2021  . COVID-19 Vaccine  Completed  . HPV VACCINES  Aged Out    Health Maintenance  Health Maintenance Due  Topic Date Due  . Hepatitis C Screening  Never done  . TETANUS/TDAP  Never done  . PNA vac Low Risk Adult (1 of 2 - PCV13) Never done    Colorectal cancer screening: No longer required.  per pt   Additional Screening:  Hepatitis C Screening: does qualify;   Vision Screening: Recommended annual ophthalmology exams for early detection of glaucoma and other disorders of the  eye. Is the patient up to date with their annual  eye exam?  Yes  Who is the provider or what is the name of the office in which the patient attends annual eye exams? [facility] for annual eye exams  If pt is not established with a provider, would they like to be referred to a provider to establish care? No .   Dental Screening: Recommended annual dental exams for proper oral hygiene  Community Resource Referral / Chronic Care Management: CRR required this visit?  No   CCM required this visit?  No      Plan:     I have personally reviewed and noted the following in the patient's chart:   . Medical and social history . Use of alcohol, tobacco or illicit drugs  . Current medications and supplements . Functional ability and status . Nutritional status . Physical activity . Advanced directives . List of other physicians . Hospitalizations, surgeries, and ER visits in previous 12 months . Vitals . Screenings to include cognitive, depression, and falls . Referrals and appointments  In addition, I have reviewed and discussed with patient certain preventive protocols, quality metrics, and best practice recommendations. A written personalized care plan for preventive services as well as general preventive health recommendations were provided to patient.     Marzella Schlein, LPN   10/26/4740   Nurse Notes: None

## 2020-11-18 NOTE — Telephone Encounter (Signed)
Called pt and sch. 5/10 

## 2020-11-18 NOTE — Telephone Encounter (Signed)
Pt received Right Greater Trochanter and Right L4-L5 IL on 01/21/20. Pt would like an repeat for his lower back pain. Please advise.

## 2020-12-02 ENCOUNTER — Ambulatory Visit: Payer: Self-pay

## 2020-12-02 ENCOUNTER — Other Ambulatory Visit: Payer: Self-pay

## 2020-12-02 ENCOUNTER — Encounter: Payer: Self-pay | Admitting: Physical Medicine and Rehabilitation

## 2020-12-02 ENCOUNTER — Ambulatory Visit (INDEPENDENT_AMBULATORY_CARE_PROVIDER_SITE_OTHER): Payer: Medicare Other | Admitting: Physical Medicine and Rehabilitation

## 2020-12-02 VITALS — BP 133/72 | HR 71

## 2020-12-02 DIAGNOSIS — M5416 Radiculopathy, lumbar region: Secondary | ICD-10-CM | POA: Diagnosis not present

## 2020-12-02 MED ORDER — BETAMETHASONE SOD PHOS & ACET 6 (3-3) MG/ML IJ SUSP
12.0000 mg | Freq: Once | INTRAMUSCULAR | Status: AC
Start: 2020-12-02 — End: 2020-12-02
  Administered 2020-12-02: 12 mg

## 2020-12-02 NOTE — Patient Instructions (Signed)

## 2020-12-02 NOTE — Progress Notes (Signed)
Harold Moore - 75 y.o. male MRN [id number]  Date of birth: April 09, 1946  Office Visit Note: Visit Date: 12/02/2020 PCP: Ardith Dark, MD Referred by: Ardith Dark, MD  Subjective: Chief Complaint  Patient presents with   Lower Back - Pain   HPI:  Harold Moore is a 75 y.o. male who comes in today For planned repeat right L4-5 interlaminar epidural steroid injection.  Mr. Corrales was seen in June of last year with that same injection and he did quite well with that.  He also had a greater trochanteric injection performed at the same time.  He is not having any pain down the legs.  This is been ongoing now for several months despite conservative care with medication management and continued home exercise plan which is being guided through physical therapy and his orthopedic surgeon and and Dr. Jimmey Ralph.  He feels he has a sharp stabbing pain here some referral in the buttock but not down the leg.  I think given his failure of getting better and the amount of pain he is having that does limit his daily activities we should go ahead and repeat the L4-5 interlaminar injection.  ROS Otherwise per HPI.  Assessment & Plan: Visit Diagnoses:    ICD-10-CM   1. Lumbar radiculopathy  M54.16 XR C-ARM NO REPORT    Epidural Steroid injection    betamethasone acetate-betamethasone sodium phosphate (CELESTONE) injection 12 mg      Plan: No additional findings.   Meds & Orders:  Meds ordered this encounter  Medications   betamethasone acetate-betamethasone sodium phosphate (CELESTONE) injection 12 mg    Orders Placed This Encounter  Procedures   XR C-ARM NO REPORT   Epidural Steroid injection    Follow-up: Return if symptoms worsen or fail to improve.   Procedures: No procedures performed  Lumbar Epidural Steroid Injection - Interlaminar Approach with Fluoroscopic Guidance  Patient: Harold Moore      Date of Birth: [birth year] MRN: [id number] PCP: Ardith Dark, MD      Visit Date:  12/02/2020   Universal Protocol:     Consent Given By: the patient  Position: PRONE  Additional Comments: Vital signs were monitored before and after the procedure. Patient was prepped and draped in the usual sterile fashion. The correct patient, procedure, and site was verified.   Injection Procedure Details:   Procedure diagnoses: Lumbar radiculopathy [M54.16]   Meds Administered:  Meds ordered this encounter  Medications   betamethasone acetate-betamethasone sodium phosphate (CELESTONE) injection 12 mg     Laterality: Right  Location/Site:  L4-L5  Needle: 3.5 in., 20 ga. Tuohy  Needle Placement: Paramedian epidural  Findings:   -Comments: Excellent flow of contrast into the epidural space.  Procedure Details: Using a paramedian approach from the side mentioned above, the region overlying the inferior lamina was localized under fluoroscopic visualization and the soft tissues overlying this structure were infiltrated with 4 ml. of 1% Lidocaine without Epinephrine. The Tuohy needle was inserted into the epidural space using a paramedian approach.   The epidural space was localized using loss of resistance along with counter oblique bi-planar fluoroscopic views.  After negative aspirate for air, blood, and CSF, a 2 ml. volume of Isovue-250 was injected into the epidural space and the flow of contrast was observed. Radiographs were obtained for documentation purposes.    The injectate was administered into the level noted above.   Additional Comments:  The patient tolerated the procedure well Dressing: 2 x 2  sterile gauze and Band-Aid    Post-procedure details: Patient was observed during the procedure. Post-procedure instructions were reviewed.  Patient left the clinic in stable condition.   Clinical History: MRI LUMBAR SPINE WITHOUT CONTRAST   TECHNIQUE: Multiplanar, multisequence MR imaging of the lumbar spine was performed. No intravenous contrast was  administered.   COMPARISON:  10/24/2019 lumbar spine radiographs.   FINDINGS: Segmentation:  Normal.   Alignment:  Straightening of lumbar lordosis.  No listhesis.   Vertebrae:  No fracture, evidence of discitis, or bone lesion.   Conus medullaris and cauda equina: Conus extends to the L1 level. Conus and cauda equina appear normal.   Paraspinal and other soft tissues: Negative.   Disc levels:   Multilevel osteophytosis with bridging ossification of the anterior longitudinal ligament and Schmorl's node formation. Multilevel desiccation with mild disc space loss.   T12-L1: No significant disc bulge, spinal canal or neural foraminal narrowing.   L1-2: Disc bulge, ligamentum flavum and bilateral facet hypertrophy. Mild bilateral neural foraminal narrowing.   L2-3: Disc bulge, ligamentum flavum and bilateral facet hypertrophy. Mild bilateral neural foraminal narrowing.   L3-4: Disc bulge with superimposed central protrusion, ligamentum flavum and bilateral facet hypertrophy. Mild spinal canal and severe bilateral neural foraminal narrowing.   L4-5: Disc bulge with superimposed right foraminal protrusion/annular fissuring (7:25) abutting the exiting right L4 nerve root, ligamentum flavum and bilateral facet hypertrophy. Mild spinal canal, severe right and moderate left neural foraminal narrowing.   L5-S1: No significant disc bulge, spinal canal or neural foraminal narrowing.   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   Moderate to severe bilateral neural foraminal narrowing at the L3-4 and L4-5 levels.   Right L4-5 foraminal protrusion abutting the exiting right L4 nerve root.   Mild L3-5 spinal canal narrowing.     Electronically Signed   By: Stana Bunting M.D.   On: 11/24/2019 13:14     Objective:  VS:  HT:    WT:   BMI:     BP:133/72  HR:71bpm  TEMP: ( )  RESP:98 % Physical Exam Vitals and  nursing note reviewed.  Constitutional:      General: He is not in acute distress.    Appearance: Normal appearance. He is not ill-appearing.  HENT:     Head: Normocephalic and atraumatic.     Right Ear: External ear normal.     Left Ear: External ear normal.     Nose: No congestion.  Eyes:     Extraocular Movements: Extraocular movements intact.  Cardiovascular:     Rate and Rhythm: Normal rate.     Pulses: Normal pulses.  Pulmonary:     Effort: Pulmonary effort is normal. No respiratory distress.  Abdominal:     General: There is no distension.     Palpations: Abdomen is soft.  Musculoskeletal:        General: No tenderness or signs of injury.     Cervical back: Neck supple.     Right lower leg: No edema.     Left lower leg: No edema.     Comments: Patient has good distal strength without clonus.  Skin:    Findings: No erythema or rash.  Neurological:     General: No focal deficit present.     Mental Status: He is alert and oriented to person, place, and time.     Sensory: No sensory deficit.     Motor: No weakness or abnormal muscle tone.  Coordination: Coordination normal.  Psychiatric:        Mood and Affect: Mood normal.        Behavior: Behavior normal.     Imaging: No results found.

## 2020-12-02 NOTE — Progress Notes (Signed)
Numeric Pain Rating Scale and Functional Assessment Average Pain 6-7   In the last MONTH (on 0-10 scale) has pain interfered with the following?  1. General activity like being  able to carry out your everyday physical activities such as walking, climbing stairs, carrying groceries, or moving a chair?  Rating(8)   +Driver, -BT, -Dye Allergies. Pain in right lower back, pain is not shooting down the leg like it was, but it feels like there is knife stuck in there.

## 2020-12-17 ENCOUNTER — Other Ambulatory Visit: Payer: Self-pay | Admitting: Family Medicine

## 2020-12-27 ENCOUNTER — Other Ambulatory Visit: Payer: Self-pay | Admitting: Family Medicine

## 2021-01-08 ENCOUNTER — Telehealth: Payer: Self-pay | Admitting: Physical Medicine and Rehabilitation

## 2021-01-08 ENCOUNTER — Telehealth: Payer: Self-pay

## 2021-01-08 NOTE — Telephone Encounter (Signed)
Pt would like to schedule an appt for his back. CB [phone]

## 2021-01-08 NOTE — Telephone Encounter (Signed)
Pt called stating he missed a call to set an appt and would like to be tried again around 1.  [phone]

## 2021-01-08 NOTE — Telephone Encounter (Signed)
Patient called he is requesting a appointment with Dr.Newton call [phone]

## 2021-01-08 NOTE — Telephone Encounter (Signed)
See previous message

## 2021-01-08 NOTE — Telephone Encounter (Signed)
Left message #1

## 2021-01-09 NOTE — Telephone Encounter (Signed)
See previous message

## 2021-01-09 NOTE — Telephone Encounter (Signed)
Scheduled for 6/23 at 1330 for bursa injection.

## 2021-01-15 ENCOUNTER — Ambulatory Visit (INDEPENDENT_AMBULATORY_CARE_PROVIDER_SITE_OTHER): Payer: Medicare Other | Admitting: Physical Medicine and Rehabilitation

## 2021-01-15 ENCOUNTER — Ambulatory Visit: Payer: Self-pay

## 2021-01-15 ENCOUNTER — Encounter: Payer: Self-pay | Admitting: Physical Medicine and Rehabilitation

## 2021-01-15 ENCOUNTER — Other Ambulatory Visit: Payer: Self-pay

## 2021-01-15 DIAGNOSIS — M7061 Trochanteric bursitis, right hip: Secondary | ICD-10-CM | POA: Diagnosis not present

## 2021-01-15 NOTE — Progress Notes (Signed)
Harold Moore - 75 y.o. male MRN [id number]  Date of birth: 01-29-1946  Office Visit Note: Visit Date: 01/15/2021 PCP: Ardith Dark, MD Referred by: Ardith Dark, MD  Subjective: Chief Complaint  Patient presents with   Right Hip - Pain   Lower Back - Pain   HPI:  Harold Moore is a 75 y.o. male who comes in today For planned repeat right greater trochanteric bursa injection with fluoroscopic guidance.  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  He has a lot of back pain from that where epidural injection seems to help his back pain greatly.  Last time I saw him was a month ago and the epidural injection has helped his back pain pretty good he is having this upper lateral hip pain.  The hip pain has been amenable to prior greater trochanteric injection by myself and prior physician.  I feel like the greater trochanteric pain may be related to the nerve root irritation but he does get relief with the trochanter injection.  He is failed all other manner of conservative care including physical therapy and medication management and time.  We will repeat the trochanter injection today.  Consider foraminal injection.  Consider consultation with spine/neurosurgeon for microdiscectomy and decompression.  ROS Otherwise per HPI.  Assessment & Plan: Visit Diagnoses:    ICD-10-CM   1. Greater trochanteric bursitis, right  M70.61 XR C-ARM NO REPORT    Large Joint Inj: R greater trochanter      Plan: No additional findings.   Meds & Orders: No orders of the defined types were placed in this encounter.   Orders Placed This Encounter  Procedures   Large Joint Inj: R greater trochanter   XR C-ARM NO REPORT    Follow-up: No follow-ups on file.   Procedures: Large Joint Inj: R greater trochanter on 01/15/2021 2:07 PM Indications: pain and diagnostic evaluation Details: 22 G 3.5 in needle, fluoroscopy-guided  lateral approach  Arthrogram: No  Medications: 4 mL lidocaine 2 %; 4 mL bupivacaine 0.25 %; 60 mg triamcinolone acetonide 40 MG/ML Outcome: tolerated well, no immediate complications  There was excellent flow of contrast outlined the greater trochanteric bursa without vascular uptake. Procedure, treatment alternatives, risks and benefits explained, specific risks discussed. Consent was given by the patient. Immediately prior to procedure a time out was called to verify the correct patient, procedure, equipment, support staff and site/side marked as required. Patient was prepped and draped in the usual sterile fashion.         Clinical History: MRI LUMBAR SPINE WITHOUT CONTRAST   TECHNIQUE: Multiplanar, multisequence MR imaging of the lumbar spine was performed. No intravenous contrast was administered.   COMPARISON:  10/24/2019 lumbar spine radiographs.   FINDINGS: Segmentation:  Normal.   Alignment:  Straightening of lumbar lordosis.  No listhesis.   Vertebrae:  No fracture, evidence of discitis, or bone lesion.   Conus medullaris and cauda equina: Conus extends to the L1 level. Conus and cauda equina appear normal.   Paraspinal and other soft tissues: Negative.   Disc levels:   Multilevel osteophytosis with bridging ossification of the anterior longitudinal ligament and Schmorl's node formation. Multilevel desiccation with mild disc space loss.   T12-L1: No significant disc bulge, spinal canal or neural foraminal narrowing.   L1-2: Disc bulge, ligamentum flavum and bilateral facet hypertrophy. Mild bilateral neural foraminal narrowing.   L2-3: Disc bulge, ligamentum flavum  and bilateral facet hypertrophy. Mild bilateral neural foraminal narrowing.   L3-4: Disc bulge with superimposed central protrusion, ligamentum flavum and bilateral facet hypertrophy. Mild spinal canal and severe bilateral neural foraminal narrowing.   L4-5: Disc bulge with superimposed right  foraminal protrusion/annular fissuring (7:25) abutting the exiting right L4 nerve root, ligamentum flavum and bilateral facet hypertrophy. Mild spinal canal, severe right and moderate left neural foraminal narrowing.   L5-S1: No significant disc bulge, spinal canal or neural foraminal narrowing.   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   Moderate to severe bilateral neural foraminal narrowing at the L3-4 and L4-5 levels.   Right L4-5 foraminal protrusion abutting the exiting right L4 nerve root.   Mild L3-5 spinal canal narrowing.     Electronically Signed   By: Stana Bunting M.D.   On: 11/24/2019 13:14     Objective:  VS:  HT:    WT:   BMI:     BP:   HR: bpm  TEMP: ( )  RESP:  Physical Exam Vitals and nursing note reviewed.  Constitutional:      General: He is not in acute distress.    Appearance: Normal appearance. He is not ill-appearing.  HENT:     Head: Normocephalic and atraumatic.     Right Ear: External ear normal.     Left Ear: External ear normal.     Nose: No congestion.  Eyes:     Extraocular Movements: Extraocular movements intact.  Cardiovascular:     Rate and Rhythm: Normal rate.     Pulses: Normal pulses.  Pulmonary:     Effort: Pulmonary effort is normal. No respiratory distress.  Abdominal:     General: There is no distension.     Palpations: Abdomen is soft.  Musculoskeletal:        General: No tenderness or signs of injury.     Cervical back: Neck supple.     Right lower leg: No edema.     Left lower leg: No edema.     Comments: Patient has good distal strength without clonus.  Concordant pain with palpation over the right greater trochanter.  Skin:    Findings: No erythema or rash.  Neurological:     General: No focal deficit present.     Mental Status: He is alert and oriented to person, place, and time.     Sensory: No sensory deficit.     Motor: No weakness or abnormal  muscle tone.     Coordination: Coordination normal.  Psychiatric:        Mood and Affect: Mood normal.        Behavior: Behavior normal.     Imaging: XR C-ARM NO REPORT  Result Date: 01/15/2021 Please see Notes tab for imaging impression.

## 2021-01-15 NOTE — Procedures (Signed)
Lumbar Epidural Steroid Injection - Interlaminar Approach with Fluoroscopic Guidance  Patient: Harold Moore      Date of Birth: 01/26/1946 MRN: [id number] PCP: Ardith Dark, MD      Visit Date: 12/02/2020   Universal Protocol:     Consent Given By: the patient  Position: PRONE  Additional Comments: Vital signs were monitored before and after the procedure. Patient was prepped and draped in the usual sterile fashion. The correct patient, procedure, and site was verified.   Injection Procedure Details:   Procedure diagnoses: Lumbar radiculopathy [M54.16]   Meds Administered:  Meds ordered this encounter  Medications   betamethasone acetate-betamethasone sodium phosphate (CELESTONE) injection 12 mg     Laterality: Right  Location/Site:  L4-L5  Needle: 3.5 in., 20 ga. Tuohy  Needle Placement: Paramedian epidural  Findings:   -Comments: Excellent flow of contrast into the epidural space.  Procedure Details: Using a paramedian approach from the side mentioned above, the region overlying the inferior lamina was localized under fluoroscopic visualization and the soft tissues overlying this structure were infiltrated with 4 ml. of 1% Lidocaine without Epinephrine. The Tuohy needle was inserted into the epidural space using a paramedian approach.   The epidural space was localized using loss of resistance along with counter oblique bi-planar fluoroscopic views.  After negative aspirate for air, blood, and CSF, a 2 ml. volume of Isovue-250 was injected into the epidural space and the flow of contrast was observed. Radiographs were obtained for documentation purposes.    The injectate was administered into the level noted above.   Additional Comments:  The patient tolerated the procedure well Dressing: 2 x 2 sterile gauze and Band-Aid    Post-procedure details: Patient was observed during the procedure. Post-procedure instructions were reviewed.  Patient left the clinic  in stable condition.

## 2021-01-15 NOTE — Progress Notes (Signed)
Pt state right hip pain that travels across his back. Pt state walking and standing for a long time makes the pain worse. Pt state he uses ice to help ease his pain.  Numeric Pain Rating Scale and Functional Assessment Average Pain 4   In the last MONTH (on 0-10 scale) has pain interfered with the following?  1. General activity like being  able to carry out your everyday physical activities such as walking, climbing stairs, carrying groceries, or moving a chair?  Rating(9)   -BT, -Dye Allergies.

## 2021-01-16 MED ORDER — BUPIVACAINE HCL 0.25 % IJ SOLN
4.0000 mL | INTRAMUSCULAR | Status: AC | PRN
  Administered 2021-01-15: 4 mL via INTRA_ARTICULAR

## 2021-01-16 MED ORDER — LIDOCAINE HCL 2 % IJ SOLN
4.0000 mL | INTRAMUSCULAR | Status: AC | PRN
  Administered 2021-01-15: 4 mL

## 2021-01-16 MED ORDER — TRIAMCINOLONE ACETONIDE 40 MG/ML IJ SUSP
60.0000 mg | INTRAMUSCULAR | Status: AC | PRN
  Administered 2021-01-15: 60 mg via INTRA_ARTICULAR

## 2021-02-28 ENCOUNTER — Other Ambulatory Visit: Payer: Self-pay | Admitting: Family Medicine

## 2021-03-01 DIAGNOSIS — I7789 Other specified disorders of arteries and arterioles: Secondary | ICD-10-CM | POA: Insufficient documentation

## 2021-03-01 NOTE — Progress Notes (Signed)
Cardiology Office Note   Date:  03/03/2021   ID:  Harold Moore, DOB [birth year], MRN [id number]  PCP:  Ardith Dark, MD  Cardiologist:   None   Chief Complaint  Patient presents with   Aortic Root Enlargement       History of Present Illness: Harold Moore is a 75 y.o. male who is referred by Ardith Dark, MD for evaluation of difficult to control HTN.  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 I did not have records from the previous cardiology office other than to know that he has had carotid Dopplers and echocardiogram and what looks like an cardiac ultrasound in the past.  I did follow up with carotid Dopplers and he had mild plaque.    He had mild aortic enlargement on echo.    Since I last saw him he is doing well.  He golfs.  He does a little bit of walking in the park. The patient denies any new symptoms such as chest discomfort, neck or arm discomfort. There has been no new shortness of breath, PND or orthopnea. There have been no reported palpitations, presyncope or syncope.    Past Medical History:  Diagnosis Date   Allergy    Arthritis    Hyperglycemia    Hyperlipidemia    Hypertension    Migraines    Positive PPD     Past Surgical History:  Procedure Laterality Date   APPENDECTOMY     SHOULDER SURGERY     TONSILLECTOMY       Current Outpatient Medications  Medication Sig Dispense Refill   amLODipine (NORVASC) 10 MG tablet TAKE 1 TABLET DAILY 90 tablet 1   aspirin EC 81 MG tablet Take 81 mg by mouth daily.     cetirizine (ZYRTEC) 10 MG tablet Take 10 mg by mouth daily.     chlorthalidone (HYGROTON) 25 MG tablet TAKE 1 TABLET DAILY 90 tablet 0   lisinopril (ZESTRIL) 20 MG tablet TAKE 1 TABLET DAILY 90 tablet 0   metFORMIN (GLUCOPHAGE-XR) 500 MG 24 hr tablet TAKE 2 TABLETS AT BEDTIME 180 tablet 3   metoprolol succinate (TOPROL-XL) 50 MG 24 hr tablet TAKE 1 TABLET DAILY 90 tablet 1   Probiotic Product (PROBIOTIC PO) Take by  mouth.     senna-docusate (SENOKOT-S) 8.6-50 MG tablet Take 1 tablet by mouth daily.     simvastatin (ZOCOR) 10 MG tablet TAKE 1 TABLET DAILY 90 tablet 1   No current facility-administered medications for this visit.    Allergies:   Codeine    ROS:  Please see the history of present illness.   Otherwise, review of systems are positive for none.   All other systems are reviewed and negative.    PHYSICAL EXAM: VS:  BP 118/64   Pulse 84   Ht 5\' 8"  (1.727 m)   Wt 215 lb 12.8 oz (97.9 kg)   BMI 32.81 kg/m  , BMI Body mass index is 32.81 kg/m. GENERAL:  Well appearing NECK:  No jugular venous distention, waveform within normal limits, carotid upstroke brisk and symmetric, no bruits, no thyromegaly LUNGS:  Clear to auscultation bilaterally CHEST:  Unremarkable HEART:  PMI not displaced or sustained,S1 and S2 within normal limits, no S3, no S4, no clicks, no rubs, no murmurs ABD:  Flat, positive bowel sounds normal in frequency in pitch, no bruits, no rebound, no guarding, no midline pulsatile mass, no hepatomegaly, no splenomegaly EXT:  2 plus  pulses throughout, no edema, no cyanosis no clubbing  EKG:  EKG is not ordered today.   Recent Labs: No results found for requested labs within last 8760 hours.    Lipid Panel    Component Value Date/Time   CHOL 177 03/06/2019 1014   TRIG 129.0 03/06/2019 1014   HDL 50.90 03/06/2019 1014   CHOLHDL 3 03/06/2019 1014   VLDL 25.8 03/06/2019 1014   LDLCALC 100 (H) 03/06/2019 1014      Wt Readings from Last 3 Encounters:  03/03/21 215 lb 12.8 oz (97.9 kg)  11/17/20 216 lb 12.8 oz (98.3 kg)  09/02/20 216 lb 12.8 oz (98.3 kg)      Other studies Reviewed: Additional studies/ records that were reviewed today include: None Review of the above records demonstrates:  NA  ASSESSMENT AND PLAN:   HTN:   Blood pressures is very well controlled on the meds as listed.  No change in therapy.   DYSLIPIDEMIA: His LDL was 100 but he has not  been checked in a couple of years and so I will have him come back for fasting lipid profile.   ABNORMAL CARDIOVASCULAR STUDY: He has mild aortic enlargement.  This was on echocardiography.  I will check a CT of his aorta.  Most likely I will follow this up in a couple of years if its only about 42 mm and unchanged from his echo.    Current medicines are reviewed at length with the patient today.  The patient does not have concerns regarding medicines.  The following changes have been made: None  Labs/ tests ordered today include:    Orders Placed This Encounter  Procedures   CT ANGIO CHEST AORTA W &/OR WO CONTRAST   CBC   Lipid panel   Basic Metabolic Panel (BMET)   EKG 12-Lead      Disposition:   FU with me in 2 years or sooner if needed.   Signed, Rollene Rotunda, MD  03/03/2021 2:23 PM    [facility]

## 2021-03-03 ENCOUNTER — Ambulatory Visit (INDEPENDENT_AMBULATORY_CARE_PROVIDER_SITE_OTHER): Payer: Medicare Other | Admitting: Cardiology

## 2021-03-03 ENCOUNTER — Other Ambulatory Visit: Payer: Self-pay

## 2021-03-03 ENCOUNTER — Encounter: Payer: Self-pay | Admitting: Cardiology

## 2021-03-03 VITALS — BP 118/64 | HR 84 | Ht 68.0 in | Wt 215.8 lb

## 2021-03-03 DIAGNOSIS — Z79899 Other long term (current) drug therapy: Secondary | ICD-10-CM | POA: Diagnosis not present

## 2021-03-03 DIAGNOSIS — I7789 Other specified disorders of arteries and arterioles: Secondary | ICD-10-CM | POA: Diagnosis not present

## 2021-03-03 DIAGNOSIS — I10 Essential (primary) hypertension: Secondary | ICD-10-CM | POA: Diagnosis not present

## 2021-03-03 DIAGNOSIS — E785 Hyperlipidemia, unspecified: Secondary | ICD-10-CM

## 2021-03-03 NOTE — Patient Instructions (Signed)
Medication Instructions:  Continue current medications  *If you need a refill on your cardiac medications before your next appointment, please call your pharmacy*   Lab Work: CBC, BMP and Fasting Lipids  If you have labs (blood work) drawn today and your tests are completely normal, you will receive your results only by: MyChart Message (if you have MyChart) OR A paper copy in the mail If you have any lab test that is abnormal or we need to change your treatment, we will call you to review the results.   Testing/Procedures: Non-Cardiac CT scanning, (CAT scanning), is a noninvasive, special x-ray that produces cross-sectional images of the body using x-rays and a computer. CT scans help physicians diagnose and treat medical conditions. For some CT exams, a contrast material is used to enhance visibility in the area of the body being studied. CT scans provide greater clarity and reveal more details than regular x-ray exams.   Follow-Up: At [facility], you and your health needs are our priority.  As part of our continuing mission to provide you with exceptional heart care, we have created designated Provider Care Teams.  These Care Teams include your primary Cardiologist (physician) and Advanced Practice Providers (APPs -  Physician Assistants and Nurse Practitioners) who all work together to provide you with the care you need, when you need it.  We recommend signing up for the patient portal called "MyChart".  Sign up information is provided on this After Visit Summary.  MyChart is used to connect with patients for Virtual Visits (Telemedicine).  Patients are able to view lab/test results, encounter notes, upcoming appointments, etc.  Non-urgent messages can be sent to your provider as well.   To learn more about what you can do with MyChart, go to [email or web address].    Your next appointment:   1 year(s)  The format for your next appointment:   In Person  Provider:   You may  see Rollene Rotunda, MD or one of the following Advanced Practice Providers on your designated Care Team:   Theodore Demark, PA-C Juanda Crumble, PA-C Joni Reining, DNP, ANP

## 2021-03-04 LAB — BASIC METABOLIC PANEL
BUN/Creatinine Ratio: 19 (ref 10–24)
BUN: 21 mg/dL (ref 8–27)
CO2: 25 mmol/L (ref 20–29)
Calcium: 10 mg/dL (ref 8.6–10.2)
Chloride: 100 mmol/L (ref 96–106)
Creatinine, Ser: 1.13 mg/dL (ref 0.76–1.27)
Glucose: 155 mg/dL — ABNORMAL HIGH (ref 65–99)
Potassium: 4.1 mmol/L (ref 3.5–5.2)
Sodium: 142 mmol/L (ref 134–144)
eGFR: 68 mL/min/{1.73_m2} (ref >59)

## 2021-03-04 LAB — CBC
Hematocrit: 38.2 % (ref 37.5–51.0)
Hemoglobin: 13.3 g/dL (ref 13.0–17.7)
MCH: 33.8 pg — ABNORMAL HIGH (ref 26.6–33.0)
MCHC: 34.8 g/dL (ref 31.5–35.7)
MCV: 97 fL (ref 79–97)
Platelets: 213 10*3/uL (ref 150–450)
RBC: 3.94 x10E6/uL — ABNORMAL LOW (ref 4.14–5.80)
RDW: 12 % (ref 11.6–15.4)
WBC: 4.2 10*3/uL (ref 3.4–10.8)

## 2021-03-04 LAB — LIPID PANEL
Chol/HDL Ratio: 3.5 ratio (ref 0.0–5.0)
Cholesterol, Total: 158 mg/dL (ref 100–199)
HDL: 45 mg/dL (ref >39)
LDL Chol Calc (NIH): 88 mg/dL (ref 0–99)
Triglycerides: 140 mg/dL (ref 0–149)
VLDL Cholesterol Cal: 25 mg/dL (ref 5–40)

## 2021-03-06 ENCOUNTER — Ambulatory Visit
Admission: RE | Admit: 2021-03-06 | Discharge: 2021-03-06 | Disposition: A | Discharge status: Home / Self Care | Payer: Medicare Other | Source: Ambulatory Visit | Attending: Cardiology | Admitting: Cardiology

## 2021-03-06 ENCOUNTER — Other Ambulatory Visit: Payer: Self-pay

## 2021-03-06 DIAGNOSIS — I7789 Other specified disorders of arteries and arterioles: Secondary | ICD-10-CM

## 2021-03-06 MED ORDER — IOPAMIDOL (ISOVUE-370) INJECTION 76%
75.0000 mL | Freq: Once | INTRAVENOUS | Status: AC | PRN
  Administered 2021-03-06: 75 mL via INTRAVENOUS

## 2021-03-09 ENCOUNTER — Other Ambulatory Visit: Payer: Self-pay | Admitting: *Deleted

## 2021-03-09 DIAGNOSIS — I712 Thoracic aortic aneurysm, without rupture, unspecified: Secondary | ICD-10-CM

## 2021-03-09 NOTE — Progress Notes (Signed)
a 

## 2021-05-13 ENCOUNTER — Other Ambulatory Visit: Payer: Self-pay | Admitting: Family Medicine

## 2021-05-25 ENCOUNTER — Other Ambulatory Visit: Payer: Self-pay | Admitting: *Deleted

## 2021-05-25 MED ORDER — METOPROLOL SUCCINATE ER 50 MG PO TB24: 50.0000 mg | ORAL_TABLET | Freq: Every day | ORAL | 1 refills | Status: DC

## 2021-05-25 MED ORDER — SIMVASTATIN 10 MG PO TABS: 10.0000 mg | ORAL_TABLET | Freq: Every day | ORAL | 1 refills | Status: DC

## 2021-05-25 MED ORDER — AMLODIPINE BESYLATE 10 MG PO TABS: 10.0000 mg | ORAL_TABLET | Freq: Every day | ORAL | 1 refills | Status: DC

## 2021-07-25 ENCOUNTER — Other Ambulatory Visit: Payer: Self-pay | Admitting: Family Medicine

## 2021-09-21 ENCOUNTER — Telehealth: Payer: Self-pay | Admitting: Physical Medicine and Rehabilitation

## 2021-09-21 NOTE — Telephone Encounter (Signed)
Patient called. He would like an appointment with Dr. Newton. His call back number is [phone] 

## 2021-09-23 ENCOUNTER — Telehealth: Payer: Self-pay | Admitting: Physical Medicine and Rehabilitation

## 2021-09-23 NOTE — Telephone Encounter (Signed)
Patient called needing an appointment for an injection in his back. The number to contact patient is [phone]  ?

## 2021-10-08 ENCOUNTER — Other Ambulatory Visit: Payer: Self-pay | Admitting: Family Medicine

## 2021-10-13 ENCOUNTER — Other Ambulatory Visit: Payer: Self-pay | Admitting: Family Medicine

## 2021-10-15 ENCOUNTER — Other Ambulatory Visit: Payer: Self-pay

## 2021-10-15 ENCOUNTER — Encounter: Payer: Self-pay | Admitting: Physical Medicine and Rehabilitation

## 2021-10-15 ENCOUNTER — Ambulatory Visit: Payer: Medicare Other

## 2021-10-15 ENCOUNTER — Ambulatory Visit (INDEPENDENT_AMBULATORY_CARE_PROVIDER_SITE_OTHER): Payer: Medicare Other | Admitting: Physical Medicine and Rehabilitation

## 2021-10-15 VITALS — BP 123/76 | HR 76

## 2021-10-15 DIAGNOSIS — M5416 Radiculopathy, lumbar region: Secondary | ICD-10-CM | POA: Diagnosis not present

## 2021-10-15 MED ORDER — METHYLPREDNISOLONE ACETATE 80 MG/ML IJ SUSP
80.0000 mg | Freq: Once | INTRAMUSCULAR | Status: AC
  Administered 2021-10-15: 80 mg

## 2021-10-15 NOTE — Progress Notes (Signed)
Pt state lower back pain that travels to both hips. Pt state walking and standing for a long time makes the pain worse. Pt state he uses ice to help ease his pain. ? ?Numeric Pain Rating Scale and Functional Assessment ?Average Pain 6 ? ? ?In the last MONTH (on 0-10 scale) has pain interfered with the following? ? ?1. General activity like being  able to carry out your everyday physical activities such as walking, climbing stairs, carrying groceries, or moving a chair?  ?Rating(9) ? ? ?+Driver, -BT, -Dye Allergies. ? ?

## 2021-10-15 NOTE — Patient Instructions (Signed)

## 2021-10-23 NOTE — Procedures (Signed)
Lumbar Epidural Steroid Injection - Interlaminar Approach with Fluoroscopic Guidance ? ?Patient: Harold Moore      ?Date of Birth: Oct 24, 1945 ?MRN: [id number] ?PCP: Ardith Dark, MD      ?Visit Date: 10/15/2021 ?  ?Universal Protocol:    ? ?Consent Given By: the patient ? ?Position: PRONE ? ?Additional Comments: ?Vital signs were monitored before and after the procedure. ?Patient was prepped and draped in the usual sterile fashion. ?The correct patient, procedure, and site was verified. ? ? ?Injection Procedure Details:  ? ?Procedure diagnoses: Lumbar radiculopathy [M54.16]  ? ?Meds Administered:  ?Meds ordered this encounter  ?Medications  ? methylPREDNISolone acetate (DEPO-MEDROL) injection 80 mg  ?  ? ?Laterality: Right ? ?Location/Site:  L4-5 ? ?Needle: 3.5 in., 20 ga. Tuohy ? ?Needle Placement: Paramedian epidural ? ?Findings:  ? -Comments: Excellent flow of contrast into the epidural space. ? ?Procedure Details: ?Using a paramedian approach from the side mentioned above, the region overlying the inferior lamina was localized under fluoroscopic visualization and the soft tissues overlying this structure were infiltrated with 4 ml. of 1% Lidocaine without Epinephrine. The Tuohy needle was inserted into the epidural space using a paramedian approach.  ? ?The epidural space was localized using loss of resistance along with counter oblique bi-planar fluoroscopic views.  After negative aspirate for air, blood, and CSF, a 2 ml. volume of Isovue-250 was injected into the epidural space and the flow of contrast was observed. Radiographs were obtained for documentation purposes.   ? ?The injectate was administered into the level noted above. ? ? ?Additional Comments:  ?The patient tolerated the procedure well ?Dressing: 2 x 2 sterile gauze and Band-Aid ?  ? ?Post-procedure details: ?Patient was observed during the procedure. ?Post-procedure instructions were reviewed. ? ?Patient left the clinic in stable condition. ?

## 2021-10-23 NOTE — Progress Notes (Signed)
? ?Harold Canneraul Hermida - 76 y.o. male MRN [id number]  Date of birth: [birth year] ? ?Office Visit Note: ?Visit Date: 10/15/2021 ?PCP: Ardith DarkParker, Caleb M, MD ?Referred by: Ardith DarkParker, Caleb M, MD ? ?Subjective: ?Chief Complaint  ?Patient presents with  ? Lower Back - Pain  ? Right Hip - Pain  ? Left Hip - Pain  ? ?HPI:  Harold Moore is a 76 y.o. male who comes in today for planned repeat Right L4-5  Lumbar Interlaminar epidural steroid injection with fluoroscopic guidance.  The patient has failed conservative care including home exercise, medications, time and activity modification.  This injection will be diagnostic and hopefully therapeutic.  Please see requesting physician notes for further details and justification. Patient received more than 50% pain relief from prior injection.  ? ?Referring: Dr. Glee ArvinMichael Xu and Dr. Annell GreeningMark Yates  ? ?ROS Otherwise per HPI. ? ?Assessment & Plan: ?Visit Diagnoses:  ?  ICD-10-CM   ?1. Lumbar radiculopathy  M54.16 XR C-ARM NO REPORT  ?  Epidural Steroid injection  ?  methylPREDNISolone acetate (DEPO-MEDROL) injection 80 mg  ?  ?  ?Plan: No additional findings.  ? ?Meds & Orders:  ?Meds ordered this encounter  ?Medications  ? methylPREDNISolone acetate (DEPO-MEDROL) injection 80 mg  ?  ?Orders Placed This Encounter  ?Procedures  ? XR C-ARM NO REPORT  ? Epidural Steroid injection  ?  ?Follow-up: Return if symptoms worsen or fail to improve.  ? ?Procedures: ?No procedures performed  ?Lumbar Epidural Steroid Injection - Interlaminar Approach with Fluoroscopic Guidance ? ?Patient: Harold Moore      ?Date of Birth: [birth year] ?MRN: [id number] ?PCP: Ardith DarkParker, Caleb M, MD      ?Visit Date: 10/15/2021 ?  ?Universal Protocol:    ? ?Consent Given By: the patient ? ?Position: PRONE ? ?Additional Comments: ?Vital signs were monitored before and after the procedure. ?Patient was prepped and draped in the usual sterile fashion. ?The correct patient, procedure, and site was verified. ? ? ?Injection Procedure Details:   ? ?Procedure diagnoses: Lumbar radiculopathy [M54.16]  ? ?Meds Administered:  ?Meds ordered this encounter  ?Medications  ? methylPREDNISolone acetate (DEPO-MEDROL) injection 80 mg  ?  ? ?Laterality: Right ? ?Location/Site:  L4-5 ? ?Needle: 3.5 in., 20 ga. Tuohy ? ?Needle Placement: Paramedian epidural ? ?Findings:  ? -Comments: Excellent flow of contrast into the epidural space. ? ?Procedure Details: ?Using a paramedian approach from the side mentioned above, the region overlying the inferior lamina was localized under fluoroscopic visualization and the soft tissues overlying this structure were infiltrated with 4 ml. of 1% Lidocaine without Epinephrine. The Tuohy needle was inserted into the epidural space using a paramedian approach.  ? ?The epidural space was localized using loss of resistance along with counter oblique bi-planar fluoroscopic views.  After negative aspirate for air, blood, and CSF, a 2 ml. volume of Isovue-250 was injected into the epidural space and the flow of contrast was observed. Radiographs were obtained for documentation purposes.   ? ?The injectate was administered into the level noted above. ? ? ?Additional Comments:  ?The patient tolerated the procedure well ?Dressing: 2 x 2 sterile gauze and Band-Aid ?  ? ?Post-procedure details: ?Patient was observed during the procedure. ?Post-procedure instructions were reviewed. ? ?Patient left the clinic in stable condition.  ? ?Clinical History: ?MRI LUMBAR SPINE WITHOUT CONTRAST  ?   ?TECHNIQUE:  ?Multiplanar, multisequence MR imaging of the lumbar spine was  ?performed. No intravenous contrast was administered.  ?   ?COMPARISON:  10/24/2019  lumbar spine radiographs.  ?   ?FINDINGS:  ?Segmentation:  Normal.  ?   ?Alignment:  Straightening of lumbar lordosis.  No listhesis.  ?   ?Vertebrae:  No fracture, evidence of discitis, or bone lesion.  ?   ?Conus medullaris and cauda equina: Conus extends to the L1 level.  ?Conus and cauda equina appear  normal.  ?   ?Paraspinal and other soft tissues: Negative.  ?   ?Disc levels:  ?   ?Multilevel osteophytosis with bridging ossification of the anterior  ?longitudinal ligament and Schmorl's node formation. Multilevel  ?desiccation with mild disc space loss.  ?   ?T12-L1: No significant disc bulge, spinal canal or neural foraminal  ?narrowing.  ?   ?L1-2: Disc bulge, ligamentum flavum and bilateral facet hypertrophy.  ?Mild bilateral neural foraminal narrowing.  ?   ?L2-3: Disc bulge, ligamentum flavum and bilateral facet hypertrophy.  ?Mild bilateral neural foraminal narrowing.  ?   ?L3-4: Disc bulge with superimposed central protrusion, ligamentum  ?flavum and bilateral facet hypertrophy. Mild spinal canal and severe  ?bilateral neural foraminal narrowing.  ?   ?L4-5: Disc bulge with superimposed right foraminal  ?protrusion/annular fissuring (7:25) abutting the exiting right L4  ?nerve root, ligamentum flavum and bilateral facet hypertrophy. Mild  ?spinal canal, severe right and moderate left neural foraminal  ?narrowing.  ?   ?L5-S1: No significant disc bulge, spinal canal or neural foraminal  ?narrowing.  ?   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  ?   ?Moderate to severe bilateral neural foraminal narrowing at the L3-4  ?and L4-5 levels.  ?   ?Right L4-5 foraminal protrusion abutting the exiting right L4 nerve  ?root.  ?   ?Mild L3-5 spinal canal narrowing.  ?   ?   ?Electronically Signed  ?  By: Stana Bunting M.D.  ?  On: 11/24/2019 13:14  ? ? ? ?Objective:  VS:  HT:    WT:   BMI:     BP:123/76  HR:76bpm  TEMP: ( )  RESP:  ?Physical Exam ?Vitals and nursing note reviewed.  ?Constitutional:   ?   General: He is not in acute distress. ?   Appearance: Normal appearance. He is not ill-appearing.  ?HENT:  ?   Head: Normocephalic and atraumatic.  ?   Right Ear: External ear normal.  ?   Left Ear: External ear normal.  ?   Nose: No congestion.   ?Eyes:  ?   Extraocular Movements: Extraocular movements intact.  ?Cardiovascular:  ?   Rate and Rhythm: Normal rate.  ?   Pulses: Normal pulses.  ?Pulmonary:  ?   Effort: Pulmonary effort is normal. No respiratory distress.  ?Abdominal:  ?   General: There is no distension.  ?   Palpations: Abdomen is soft.  ?Musculoskeletal:     ?   General: No tenderness or signs of injury.  ?   Cervical back: Neck supple.  ?   Right lower leg: No edema.  ?   Left lower leg: No edema.  ?   Comments: Patient has good distal strength without clonus.  ?Skin: ?   Findings: No erythema or rash.  ?Neurological:  ?   General: No focal deficit present.  ?   Mental Status: He is alert and oriented to person, place, and time.  ?   Sensory: No sensory deficit.  ?   Motor: No weakness or abnormal muscle tone.  ?  Coordination: Coordination normal.  ?Psychiatric:     ?   Mood and Affect: Mood normal.     ?   Behavior: Behavior normal.  ?  ? ?Imaging: ?No results found. ?

## 2021-10-29 ENCOUNTER — Encounter: Payer: Self-pay | Admitting: Physical Medicine and Rehabilitation

## 2021-10-29 ENCOUNTER — Ambulatory Visit: Payer: Self-pay

## 2021-10-29 ENCOUNTER — Ambulatory Visit (INDEPENDENT_AMBULATORY_CARE_PROVIDER_SITE_OTHER): Payer: Medicare Other | Admitting: Physical Medicine and Rehabilitation

## 2021-10-29 DIAGNOSIS — M7062 Trochanteric bursitis, left hip: Secondary | ICD-10-CM | POA: Diagnosis not present

## 2021-10-29 DIAGNOSIS — M7061 Trochanteric bursitis, right hip: Secondary | ICD-10-CM | POA: Diagnosis not present

## 2021-10-29 NOTE — Progress Notes (Signed)
Pt state both hip pain. Pt state he takes over the counter pain meds to help ease his pain. Pt state walking makes the pain worse. ? ?Numeric Pain Rating Scale and Functional Assessment ?Average Pain 4 ? ? ?In the last MONTH (on 0-10 scale) has pain interfered with the following? ? ?1. General activity like being  able to carry out your everyday physical activities such as walking, climbing stairs, carrying groceries, or moving a chair?  ?Rating(8) ? ? ? -BT, -Dye Allergies. ? ?

## 2021-10-29 NOTE — Progress Notes (Signed)
? ?Harold Moore - 76 y.o. male MRN [id number]  Date of birth: 1945-08-22 ? ?Office Visit Note: ?Visit Date: 10/29/2021 ?PCP: Ardith Dark, MD ?Referred by: Ardith Dark, MD ? ?Subjective: ?Chief Complaint  ?Patient presents with  ? Right Hip - Pain  ? Left Hip - Pain  ? ?HPI:  Harold Moore is a 76 y.o. male who comes in today for planned repeat Bilateral greater trochanter injections with fluoroscopic guidance.  The patient has failed conservative care including home exercise, medications, time and activity modification. Prior injection gave more than 50% relief for several months. This injection will be diagnostic and hopefully therapeutic.  Please see requesting physician notes for further details and justification. ? ?Referring: Ellin Goodie, FNP  ? ?ROS Otherwise per HPI. ? ?Assessment & Plan: ?Visit Diagnoses:  ?  ICD-10-CM   ?1. Greater trochanteric bursitis, left  M70.62 XR C-ARM NO REPORT  ?  ?2. Greater trochanteric bursitis, right  M70.61 XR C-ARM NO REPORT  ?  ?  ?Plan: No additional findings.  ? ?Meds & Orders: No orders of the defined types were placed in this encounter. ?  ?Orders Placed This Encounter  ?Procedures  ? XR C-ARM NO REPORT  ?  ?Follow-up: No follow-ups on file.  ? ?Procedures: ?Large Joint Inj: bilateral greater trochanter on 10/29/2021 8:42 AM ?Indications: pain and diagnostic evaluation ?Details: 22 G 3.5 in needle, lateral approach ? ?Arthrogram: No ? ?Medications (Right): 5 mL bupivacaine 0.25 %; 40 mg triamcinolone acetonide 40 MG/ML ?Medications (Left): 5 mL bupivacaine 0.25 %; 40 mg triamcinolone acetonide 40 MG/ML ?Outcome: tolerated well, no immediate complications ? ?Greatest area of pain over the greater trochanter was palpated and marked prior to injection. The patient did seem to have relief after the injection. ?Procedure, treatment alternatives, risks and benefits explained, specific risks discussed. Consent was given by the patient. Immediately prior to procedure a time  out was called to verify the correct patient, procedure, equipment, support staff and site/side marked as required. Patient was prepped and draped in the usual sterile fashion.  ? ?  ?   ? ?Clinical History: ?MRI LUMBAR SPINE WITHOUT CONTRAST  ?   ?TECHNIQUE:  ?Multiplanar, multisequence MR imaging of the lumbar spine was  ?performed. No intravenous contrast was administered.  ?   ?COMPARISON:  10/24/2019 lumbar spine radiographs.  ?   ?FINDINGS:  ?Segmentation:  Normal.  ?   ?Alignment:  Straightening of lumbar lordosis.  No listhesis.  ?   ?Vertebrae:  No fracture, evidence of discitis, or bone lesion.  ?   ?Conus medullaris and cauda equina: Conus extends to the L1 level.  ?Conus and cauda equina appear normal.  ?   ?Paraspinal and other soft tissues: Negative.  ?   ?Disc levels:  ?   ?Multilevel osteophytosis with bridging ossification of the anterior  ?longitudinal ligament and Schmorl's node formation. Multilevel  ?desiccation with mild disc space loss.  ?   ?T12-L1: No significant disc bulge, spinal canal or neural foraminal  ?narrowing.  ?   ?L1-2: Disc bulge, ligamentum flavum and bilateral facet hypertrophy.  ?Mild bilateral neural foraminal narrowing.  ?   ?L2-3: Disc bulge, ligamentum flavum and bilateral facet hypertrophy.  ?Mild bilateral neural foraminal narrowing.  ?   ?L3-4: Disc bulge with superimposed central protrusion, ligamentum  ?flavum and bilateral facet hypertrophy. Mild spinal canal and severe  ?bilateral neural foraminal narrowing.  ?   ?L4-5: Disc bulge with superimposed right foraminal  ?protrusion/annular fissuring (7:25) abutting  the exiting right L4  ?nerve root, ligamentum flavum and bilateral facet hypertrophy. Mild  ?spinal canal, severe right and moderate left neural foraminal  ?narrowing.  ?   ?L5-S1: No significant disc bulge, spinal canal or neural foraminal  ?narrowing.  ?   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  ?    ?Moderate to severe bilateral neural foraminal narrowing at the L3-4  ?and L4-5 levels.  ?   ?Right L4-5 foraminal protrusion abutting the exiting right L4 nerve  ?root.  ?   ?Mild L3-5 spinal canal narrowing.  ?   ?   ?Electronically Signed  ?  By: Stana Bunting M.D.  ?  On: 11/24/2019 13:14  ? ? ? ?Objective:  VS:  HT:    WT:   BMI:     BP:   HR: bpm  TEMP: ( )  RESP:  ?Physical Exam  ? ?Imaging: ?No results found. ?

## 2021-10-31 ENCOUNTER — Other Ambulatory Visit: Payer: Self-pay | Admitting: Family Medicine

## 2021-10-31 DIAGNOSIS — R7303 Prediabetes: Secondary | ICD-10-CM

## 2021-11-08 ENCOUNTER — Other Ambulatory Visit: Payer: Self-pay | Admitting: Family Medicine

## 2021-11-09 ENCOUNTER — Other Ambulatory Visit: Payer: Self-pay | Admitting: Family Medicine

## 2021-11-10 ENCOUNTER — Encounter: Payer: Self-pay | Admitting: Family Medicine

## 2021-11-10 ENCOUNTER — Ambulatory Visit (INDEPENDENT_AMBULATORY_CARE_PROVIDER_SITE_OTHER): Payer: Medicare Other | Admitting: Family Medicine

## 2021-11-10 VITALS — BP 129/80 | HR 76 | Temp 98.2°F | Ht 68.0 in | Wt 214.6 lb

## 2021-11-10 DIAGNOSIS — Z23 Encounter for immunization: Secondary | ICD-10-CM | POA: Diagnosis not present

## 2021-11-10 DIAGNOSIS — E785 Hyperlipidemia, unspecified: Secondary | ICD-10-CM

## 2021-11-10 DIAGNOSIS — I10 Essential (primary) hypertension: Secondary | ICD-10-CM | POA: Diagnosis not present

## 2021-11-10 DIAGNOSIS — R739 Hyperglycemia, unspecified: Secondary | ICD-10-CM

## 2021-11-10 NOTE — Assessment & Plan Note (Signed)
At goal.  Continue amlodipine 10 mg daily, chlorthalidone 25 mg daily, lisinopril 20 mg daily, and metoprolol succinate 50 mg daily. ?

## 2021-11-10 NOTE — Addendum Note (Signed)
Addended by: Dyann Kief on: 11/10/2021 03:27 PM ? ? Modules accepted: Orders ? ?

## 2021-11-10 NOTE — Patient Instructions (Signed)
It was very nice to see you today! ? ?We will give your pneumonia shot today. ? ?We will check blood work today. ? ?I will see you back in a year for your next physical.  Please come back to see me sooner if needed. ? ?Take care, ?Dr Jimmey Ralph ? ?PLEASE NOTE: ? ?If you had any lab tests please let us know if you have not heard back within a few days. You may see your results on mychart before we have a chance to review them but we will give you a call once they are reviewed by [place]. If we ordered any referrals today, please let us know if you have not heard from their office within the next week.  ? ?Please try these tips to maintain a healthy lifestyle: ? ?Eat at least 3 REAL meals and 1-2 snacks per day.  Aim for no more than 5 hours between eating.  If you eat breakfast, please do so within one hour of getting up.  ? ?Each meal should contain half fruits/vegetables, one quarter protein, and one quarter carbs (no bigger than a computer mouse) ? ?Cut down on sweet beverages. This includes juice, soda, and sweet tea.  ? ?Drink at least 1 glass of water with each meal and aim for at least 8 glasses per day ? ?Exercise at least 150 minutes every week.   ? ?Preventive Care 108 Years and Older, Male ?Preventive care refers to lifestyle choices and visits with your health care provider that can promote health and wellness. Preventive care visits are also called wellness exams. ?What can I expect for my preventive care visit? ?Counseling ?During your preventive care visit, your health care provider may ask about your: ?Medical history, including: ?Past medical problems. ?Family medical history. ?History of falls. ?Current health, including: ?Emotional well-being. ?Home life and relationship well-being. ?Sexual activity. ?Memory and ability to understand (cognition). ?Lifestyle, including: ?Alcohol, nicotine or tobacco, and drug use. ?Access to firearms. ?Diet, exercise, and sleep habits. ?Work and work Astronomer. ?Sunscreen  use. ?Safety issues such as seatbelt and bike helmet use. ?Physical exam ?Your health care provider will check your: ?Height and weight. These may be used to calculate your BMI (body mass index). BMI is a measurement that tells if you are at a healthy weight. ?Waist circumference. This measures the distance around your waistline. This measurement also tells if you are at a healthy weight and may help predict your risk of certain diseases, such as type 2 diabetes and high blood pressure. ?Heart rate and blood pressure. ?Body temperature. ?Skin for abnormal spots. ?What immunizations do I need? ? ?Vaccines are usually given at various ages, according to a schedule. Your health care provider will recommend vaccines for you based on your age, medical history, and lifestyle or other factors, such as travel or where you work. ?What tests do I need? ?Screening ?Your health care provider may recommend screening tests for certain conditions. This may include: ?Lipid and cholesterol levels. ?Diabetes screening. This is done by checking your blood sugar (glucose) after you have not eaten for a while (fasting). ?Hepatitis C test. ?Hepatitis B test. ?HIV (human immunodeficiency virus) test. ?STI (sexually transmitted infection) testing, if you are at risk. ?Lung cancer screening. ?Colorectal cancer screening. ?Prostate cancer screening. ?Abdominal aortic aneurysm (AAA) screening. You may need this if you are a current or former smoker. ?Talk with your health care provider about your test results, treatment options, and if necessary, the need for more tests. ?Follow these  instructions at home: ?Eating and drinking ? ?Eat a diet that includes fresh fruits and vegetables, whole grains, lean protein, and low-fat dairy products. Limit your intake of foods with high amounts of sugar, saturated fats, and salt. ?Take vitamin and mineral supplements as recommended by your health care provider. ?Do not drink alcohol if your health care  provider tells you not to drink. ?If you drink alcohol: ?Limit how much you have to 0-2 drinks a day. ?Know how much alcohol is in your drink. In the [place] one drink equals one 12 oz bottle of beer (355 mL), one 5 oz glass of wine (148 mL), or one 1? oz glass of hard liquor (44 mL). ?Lifestyle ?Brush your teeth every morning and night with fluoride toothpaste. Floss one time each day. ?Exercise for at least 30 minutes 5 or more days each week. ?Do not use any products that contain nicotine or tobacco. These products include cigarettes, chewing tobacco, and vaping devices, such as e-cigarettes. If you need help quitting, ask your health care provider. ?Do not use drugs. ?If you are sexually active, practice safe sex. Use a condom or other form of protection to prevent STIs. ?Take aspirin only as told by your health care provider. Make sure that you understand how much to take and what form to take. Work with your health care provider to find out whether it is safe and beneficial for you to take aspirin daily. ?Ask your health care provider if you need to take a cholesterol-lowering medicine (statin). ?Find healthy ways to manage stress, such as: ?Meditation, yoga, or listening to music. ?Journaling. ?Talking to a trusted person. ?Spending time with friends and family. ?Safety ?Always wear your seat belt while driving or riding in a vehicle. ?Do not drive: ?If you have been drinking alcohol. Do not ride with someone who has been drinking. ?When you are tired or distracted. ?While texting. ?If you have been using any mind-altering substances or drugs. ?Wear a helmet and other protective equipment during sports activities. ?If you have firearms in your house, make sure you follow all gun safety procedures. ?Minimize exposure to UV radiation to reduce your risk of skin cancer. ?What's next? ?Visit your health care provider once a year for an annual wellness visit. ?Ask your health care provider how often you should have  your eyes and teeth checked. ?Stay up to date on all vaccines. ?This information is not intended to replace advice given to you by your health care provider. Make sure you discuss any questions you have with your health care provider. ?Document Revised: 01/07/2021 Document Reviewed: 01/07/2021 ?Elsevier Patient Education ? 2023 [employer] ? ?

## 2021-11-10 NOTE — Assessment & Plan Note (Signed)
Check A1c.  He is on metformin 1000 mg daily.  We discussed lifestyle modifications. ?

## 2021-11-10 NOTE — Assessment & Plan Note (Signed)
Check labs today.  Continue simvastatin 10 mg daily. ?

## 2021-11-10 NOTE — Progress Notes (Signed)
? ?Chief Complaint:  ?Harold Moore is a 76 y.o. male who presents today for his annual comprehensive physical exam.   ? ?Assessment/Plan:  ?Chronic Problems Addressed Today: ?Dyslipidemia ?Check labs today.  Continue simvastatin 10 mg daily. ? ?Hyperglycemia ?Check A1c.  He is on metformin 1000 mg daily.  We discussed lifestyle modifications. ? ?Essential hypertension ?At goal.  Continue amlodipine 10 mg daily, chlorthalidone 25 mg daily, lisinopril 20 mg daily, and metoprolol succinate 50 mg daily. ? ?Preventative Healthcare: ?Will get blood work done today. Prevnar 20 given today. Will get tetanus at the pharmacy. Deferred colonoscopy  ? ?Patient Counseling(The following topics were reviewed and/or handout was given): ? -Nutrition: Stressed importance of moderation in sodium/caffeine intake, saturated fat and cholesterol, caloric balance, sufficient intake of fresh fruits, vegetables, and fiber. ? -Stressed the importance of regular exercise.  ? -Substance Abuse: Discussed cessation/primary prevention of tobacco, alcohol, or other drug use; driving or other dangerous activities under the influence; availability of treatment for abuse.  ? -Injury prevention: Discussed safety belts, safety helmets, smoke detector, smoking near bedding or upholstery.  ? -Sexuality: Discussed sexually transmitted diseases, partner selection, use of condoms, avoidance of unintended pregnancy and contraceptive alternatives.  ? -Dental health: Discussed importance of regular tooth brushing, flossing, and dental visits. ? -Health maintenance and immunizations reviewed. Please refer to Health maintenance section. ? ?Return to care in 1 year for next preventative visit.  ? ?  ?Subjective:  ?HPI: ? ?He has no acute complaints today.  ? ?He had blood work done which showed borderline diabetic. He is concern about this issue. He is currently taking Metformin 500 mg twice daily.  He is tolerating his medication. No side effects. He has been  trying to follow healthy diet.   ? ?Lifestyle ?Diet: Balanced. ?Exercise: None specific. ? ? ?  11/10/2021  ?  2:34 PM  ?Depression screen PHQ 2/9  ?Decreased Interest 0  ?Down, Depressed, Hopeless 0  ?PHQ - 2 Score 0  ? ? ?Health Maintenance Due  ?Topic Date Due  ? Hepatitis C Screening  Never done  ? TETANUS/TDAP  Never done  ? Pneumonia Vaccine 74+ Years old (1 - PCV) Never done  ?  ? ?ROS: Per HPI, otherwise a complete review of systems was negative.  ? ?PMH: ? ?The following were reviewed and entered/updated in epic: ?Past Medical History:  ?Diagnosis Date  ? Allergy   ? Arthritis   ? Hyperglycemia   ? Hyperlipidemia   ? Hypertension   ? Migraines   ? Positive PPD   ? ?Patient Active Problem List  ? Diagnosis Date Noted  ? 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 Essential hypertension 03/06/2019  ? Hyperglycemia 03/06/2019  ? Dyslipidemia 03/06/2019  ? Allergic rhinitis 03/06/2019  ? Osteoarthritis 03/06/2019  ? ?Past Surgical History:  ?Procedure Laterality Date  ? APPENDECTOMY    ? SHOULDER SURGERY    ? TONSILLECTOMY    ? ? ?Family History  ?Problem Relation Age of Onset  ? Heart disease Mother   ? Stroke Mother   ? Hyperlipidemia Mother   ? Hypertension Mother   ? Heart disease Father   ? Prostate cancer Father   ? Hearing loss Sister   ? Hyperlipidemia Sister   ? Hypertension Sister   ? Heart disease Sister   ? Thyroid cancer Maternal Grandmother   ? Hearing loss Sister   ? Hearing loss Brother   ? Hyperlipidemia Brother   ?  Colon cancer Neg Hx   ? ? ?Medications- reviewed and updated ?Current Outpatient Medications  ?Medication Sig Dispense Refill  ? amLODipine (NORVASC) 10 MG tablet TAKE 1 TABLET DAILY 30 tablet 0  ? aspirin EC 81 MG tablet Take 81 mg by mouth daily.    ? cetirizine (ZYRTEC) 10 MG tablet Take 10 mg by mouth daily.    ? chlorthalidone (HYGROTON) 25 MG tablet TAKE 1 TABLET DAILY 90 tablet 0  ? lisinopril (ZESTRIL) 20 MG tablet TAKE 1 TABLET  DAILY 90 tablet 0  ? metFORMIN (GLUCOPHAGE-XR) 500 MG 24 hr tablet TAKE 2 TABLETS AT BEDTIME 60 tablet 0  ? metoprolol succinate (TOPROL-XL) 50 MG 24 hr tablet TAKE 1 TABLET DAILY, WITH  OR IMMEDIATELY FOLLOWING A MEAL 30 tablet 0  ? Probiotic Product (PROBIOTIC PO) Take by mouth.    ? senna-docusate (SENOKOT-S) 8.6-50 MG tablet Take 1 tablet by mouth daily.    ? simvastatin (ZOCOR) 10 MG tablet TAKE 1 TABLET DAILY 30 tablet 0  ? ?No current facility-administered medications for this visit.  ? ? ?Allergies-reviewed and updated ?Allergies  ?Allergen Reactions  ? Codeine Anaphylaxis  ? ? ?Social History  ? ?Socioeconomic History  ? Marital status: Widowed  ?  Spouse name: Not on file  ? Number of children: Not on file  ? Years of education: Not on file  ? Highest education level: Not on file  ?Occupational History  ? Not on file  ?Tobacco Use  ? Smoking status: Former  ?  Types: Cigarettes  ?  Quit date: 03/05/1972  ?  Years since quitting: 49.7  ? Smokeless tobacco: Never  ?Vaping Use  ? Vaping Use: Never used  ?Substance and Sexual Activity  ? Alcohol use: Yes  ? Drug use: Never  ? Sexual activity: Not on file  ?Other Topics Concern  ? Not on file  ?Social History Narrative  ? Not on file  ? ?Social Determinants of Health  ? ?Financial Resource Strain: Low Risk   ? Difficulty of Paying Living Expenses: Not hard at all  ?Food Insecurity: No Food Insecurity  ? Worried About Charity fundraiser in the Last Year: Never true  ? Ran Out of Food in the Last Year: Never true  ?Transportation Needs: No Transportation Needs  ? Lack of Transportation (Medical): No  ? Lack of Transportation (Non-Medical): No  ?Physical Activity: Insufficiently Active  ? Days of Exercise per Week: 4 days  ? Minutes of Exercise per Session: 30 min  ?Stress: No Stress Concern Present  ? Feeling of Stress : Not at all  ?Social Connections: Socially Isolated  ? Frequency of Communication with Friends and Family: More than three times a week  ?  Frequency of Social Gatherings with Friends and Family: Twice a week  ? Attends Religious Services: Never  ? Active Member of Clubs or Organizations: No  ? Attends Archivist Meetings: Never  ? Marital Status: Widowed  ? ?   ?  ?Objective:  ?Physical Exam: ?BP 129/80   Pulse 76   Temp 98.2 ?F (36.8 ?C) (Temporal)   Ht 5\' 8"  (1.727 m)   Wt 214 lb 9.6 oz (97.3 kg)   SpO2 98%   BMI 32.63 kg/m?   ?Body mass index is 32.63 kg/m?. ?Wt Readings from Last 3 Encounters:  ?11/10/21 214 lb 9.6 oz (97.3 kg)  ?03/03/21 215 lb 12.8 oz (97.9 kg)  ?11/17/20 216 lb 12.8 oz (98.3 kg)  ? ?Gen: NAD,  resting comfortably ?HEENT: TMs normal bilaterally. OP clear. No thyromegaly noted.  ?CV: RRR with no murmurs appreciated ?Pulm: NWOB, CTAB with no crackles, wheezes, or rhonchi ?GI: Normal bowel sounds present. Soft, Nontender, Nondistended. ?MSK: no edema, cyanosis, or clubbing noted ?Skin: warm, dry ?Neuro: CN2-12 grossly intact. Strength 5/5 in upper and lower extremities. Reflexes symmetric and intact bilaterally.  ?Psych: Normal affect and thought content ?   ? ? ?I,Savera Zaman,acting as a Education administrator for Dimas Chyle, MD.,have documented all relevant documentation on the behalf of Dimas Chyle, MD,as directed by  Dimas Chyle, MD while in the presence of Dimas Chyle, MD.  ? ?I, Dimas Chyle, MD, have reviewed all documentation for this visit. The documentation on 11/10/21 for the exam, diagnosis, procedures, and orders are all accurate and complete. ? ?Algis Greenhouse. Jerline Pain, MD ?11/10/2021 3:12 PM  ? ?

## 2021-11-11 LAB — CBC
HCT: 39.7 % (ref 39.0–52.0)
Hemoglobin: 13.7 g/dL (ref 13.0–17.0)
MCHC: 34.5 g/dL (ref 30.0–36.0)
MCV: 100.5 fl — ABNORMAL HIGH (ref 78.0–100.0)
Platelets: 208 10*3/uL (ref 150.0–400.0)
RBC: 3.95 Mil/uL — ABNORMAL LOW (ref 4.22–5.81)
RDW: 14.1 % (ref 11.5–15.5)
WBC: 4.9 10*3/uL (ref 4.0–10.5)

## 2021-11-11 LAB — COMPREHENSIVE METABOLIC PANEL
ALT: 23 U/L (ref 0–53)
AST: 18 U/L (ref 0–37)
Albumin: 4.5 g/dL (ref 3.5–5.2)
Alkaline Phosphatase: 48 U/L (ref 39–117)
BUN: 25 mg/dL — ABNORMAL HIGH (ref 6–23)
CO2: 30 mEq/L (ref 19–32)
Calcium: 9.6 mg/dL (ref 8.4–10.5)
Chloride: 100 mEq/L (ref 96–112)
Creatinine, Ser: 1.24 mg/dL (ref 0.40–1.50)
GFR: 56.89 mL/min — ABNORMAL LOW (ref >60.00)
Glucose, Bld: 160 mg/dL — ABNORMAL HIGH (ref 70–99)
Potassium: 3.8 mEq/L (ref 3.5–5.1)
Sodium: 139 mEq/L (ref 135–145)
Total Bilirubin: 0.4 mg/dL (ref 0.2–1.2)
Total Protein: 7.3 g/dL (ref 6.0–8.3)

## 2021-11-11 LAB — HEMOGLOBIN A1C: Hgb A1c MFr Bld: 7.4 % — ABNORMAL HIGH (ref 4.6–6.5)

## 2021-11-11 LAB — LIPID PANEL
Cholesterol: 148 mg/dL (ref 0–200)
HDL: 52.2 mg/dL (ref >39.00)
LDL Cholesterol: 61 mg/dL (ref 0–99)
NonHDL: 95.56
Total CHOL/HDL Ratio: 3
Triglycerides: 174 mg/dL — ABNORMAL HIGH (ref 0.0–149.0)
VLDL: 34.8 mg/dL (ref 0.0–40.0)

## 2021-11-11 LAB — TSH: TSH: 1.82 u[IU]/mL (ref 0.35–5.50)

## 2021-11-13 NOTE — Progress Notes (Signed)
Please inform patient of the following: ? ?His blood sugar is up a bit.  Recommend increasing metformin to 1000 mg twice daily.  We should recheck in 3 to 6 months.  He should continue work on cutting out sugar and carbs. ? ?Everything else is stable and we can recheck in a year.

## 2021-11-17 MED ORDER — TRIAMCINOLONE ACETONIDE 40 MG/ML IJ SUSP
40.0000 mg | INTRAMUSCULAR | Status: AC | PRN
  Administered 2021-10-29: 40 mg via INTRA_ARTICULAR

## 2021-11-17 MED ORDER — BUPIVACAINE HCL 0.25 % IJ SOLN
5.0000 mL | INTRAMUSCULAR | Status: AC | PRN
  Administered 2021-10-29: 5 mL via INTRA_ARTICULAR

## 2021-11-20 ENCOUNTER — Ambulatory Visit: Payer: Medicare Other

## 2021-11-23 ENCOUNTER — Other Ambulatory Visit: Payer: Self-pay | Admitting: Family Medicine

## 2021-11-23 DIAGNOSIS — R7303 Prediabetes: Secondary | ICD-10-CM

## 2021-11-30 ENCOUNTER — Encounter: Payer: Self-pay | Admitting: Family Medicine

## 2021-11-30 ENCOUNTER — Telehealth (INDEPENDENT_AMBULATORY_CARE_PROVIDER_SITE_OTHER): Payer: Medicare Other | Admitting: Family Medicine

## 2021-11-30 VITALS — BP 138/80 | Temp 100.0°F

## 2021-11-30 DIAGNOSIS — U071 COVID-19: Secondary | ICD-10-CM

## 2021-11-30 MED ORDER — MOLNUPIRAVIR EUA 200MG CAPSULE: 4.0000 | ORAL_CAPSULE | Freq: Two times a day (BID) | ORAL | 0 refills | Status: AC

## 2021-11-30 NOTE — Progress Notes (Signed)
Virtual Visit via Video Note ? ?I connected with Harold Moore on 11/30/21 at  4:15 PM EDT by a video enabled telemedicine application 2/2 COVID-19 pandemic and verified that I am speaking with the correct person using two identifiers. ? Location patient: home ?Location provider:work or home office ?Persons participating in the virtual visit: patient, provider ? ?I discussed the limitations of evaluation and management by telemedicine and the availability of in person appointments. The patient expressed understanding and agreed to proceed. ? ?Chief Complaint  ?Patient presents with  ? Covid Positive  ?  Tested positive today. Cough non productive, fever both symptoms started last night. Denies SOB  ? ? ?HPI: ?Pt states this am his neck was hurting, felt hot, took temp which was 99 F.  Temp now 100 F.  Pt also has dry, hacking, cough, rhinorrhea, scratchy throat. Appetite is down.  Drinking water.  Denies HA, diarrhea, n/v.  Has not tried anything for symptoms.  Sick contacts include pt's nephew and his wife who were sick last wk. ? ?ROS: See pertinent positives and negatives per HPI. ? ?Past Medical History:  ?Diagnosis Date  ? Allergy   ? Arthritis   ? Hyperglycemia   ? Hyperlipidemia   ? Hypertension   ? Migraines   ? Positive PPD   ? ? ?Past Surgical History:  ?Procedure Laterality Date  ? APPENDECTOMY    ? SHOULDER SURGERY    ? TONSILLECTOMY    ? ? ?Family History  ?Problem Relation Age of Onset  ? Heart disease Mother   ? Stroke Mother   ? Hyperlipidemia Mother   ? Hypertension Mother   ? Heart disease Father   ? Prostate cancer Father   ? Hearing loss Sister   ? Hyperlipidemia Sister   ? Hypertension Sister   ? Heart disease Sister   ? Thyroid cancer Maternal Grandmother   ? Hearing loss Sister   ? Hearing loss Brother   ? Hyperlipidemia Brother   ? Colon cancer Neg Hx   ? ? ?Current Outpatient Medications:  ?  amLODipine (NORVASC) 10 MG tablet, TAKE 1 TABLET DAILY, Disp: 30 tablet, Rfl: 0 ?  aspirin EC 81 MG  tablet, Take 81 mg by mouth daily., Disp: , Rfl:  ?  cetirizine (ZYRTEC) 10 MG tablet, Take 10 mg by mouth daily., Disp: , Rfl:  ?  chlorthalidone (HYGROTON) 25 MG tablet, TAKE 1 TABLET DAILY, Disp: 90 tablet, Rfl: 0 ?  lisinopril (ZESTRIL) 20 MG tablet, TAKE 1 TABLET DAILY, Disp: 90 tablet, Rfl: 0 ?  metFORMIN (GLUCOPHAGE-XR) 500 MG 24 hr tablet, TAKE 2 TABLETS AT BEDTIME, Disp: 60 tablet, Rfl: 0 ?  metoprolol succinate (TOPROL-XL) 50 MG 24 hr tablet, TAKE 1 TABLET DAILY, WITH  OR IMMEDIATELY FOLLOWING A MEAL, Disp: 30 tablet, Rfl: 0 ?  Probiotic Product (PROBIOTIC PO), Take by mouth., Disp: , Rfl:  ?  senna-docusate (SENOKOT-S) 8.6-50 MG tablet, Take 1 tablet by mouth daily., Disp: , Rfl:  ?  simvastatin (ZOCOR) 10 MG tablet, TAKE 1 TABLET DAILY, Disp: 30 tablet, Rfl: 0 ? ?EXAM: ? ?VITALS per patient if applicable:  RR between 12-20 bpm ? ?GENERAL: alert, oriented, appears well and in no acute distress ? ?HEENT: atraumatic, conjunctiva clear, no obvious abnormalities on inspection of external nose and ears ? ?NECK: normal movements of the head and neck ? ?LUNGS: Intermittent dry cough.  On inspection no signs of respiratory distress, breathing rate appears normal, no obvious gross SOB, gasping or wheezing ? ?  CV: no obvious cyanosis ? ?MS: moves all visible extremities without noticeable abnormality ? ?PSYCH/NEURO: pleasant and cooperative, no obvious depression or anxiety, speech and thought processing grossly intact ? ?ASSESSMENT AND PLAN: ? ?Discussed the following assessment and plan: ? ?COVID-19 virus infection  ?-Symptoms starting today and positive COVID testing today, 11/30/2021 ?-Discussed expectant management.  Okay to use OTC cough/cold medications, rest, hydration, gargling with warm salt water Chloraseptic spray, etc. ?-Discussed r/b/a of antiviral medications.  Patient wishes to start medication. ?-Rx for molnupiravir sent to pharmacy ?-Patient given strict precautions ?-Discussed quarantine x5 days  been wearing mask x 5 days if has to be out ?- Plan: molnupiravir EUA (LAGEVRIO) 200 mg CAPS capsule ? ?Follow-up as needed ?  ?I discussed the assessment and treatment plan with the patient. The patient was provided an opportunity to ask questions and all were answered. The patient agreed with the plan and demonstrated an understanding of the instructions. ?  ?The patient was advised to call back or seek an in-person evaluation if the symptoms worsen or if the condition fails to improve as anticipated. ? ? ?Deeann Saint, MD  ? ?

## 2021-12-04 ENCOUNTER — Other Ambulatory Visit: Payer: Self-pay | Admitting: Family Medicine

## 2021-12-16 ENCOUNTER — Other Ambulatory Visit: Payer: Self-pay | Admitting: Family Medicine

## 2021-12-16 DIAGNOSIS — R7303 Prediabetes: Secondary | ICD-10-CM

## 2021-12-20 ENCOUNTER — Other Ambulatory Visit: Payer: Self-pay | Admitting: Family Medicine

## 2021-12-30 ENCOUNTER — Other Ambulatory Visit: Payer: Self-pay | Admitting: Family Medicine

## 2022-01-04 ENCOUNTER — Other Ambulatory Visit: Payer: Self-pay | Admitting: *Deleted

## 2022-01-04 DIAGNOSIS — R7303 Prediabetes: Secondary | ICD-10-CM

## 2022-01-04 MED ORDER — METFORMIN HCL 1000 MG PO TABS: 1000.0000 mg | ORAL_TABLET | Freq: Two times a day (BID) | ORAL | 0 refills | Status: DC

## 2022-01-16 ENCOUNTER — Other Ambulatory Visit: Payer: Self-pay | Admitting: Family Medicine

## 2022-01-16 DIAGNOSIS — R7303 Prediabetes: Secondary | ICD-10-CM

## 2022-01-21 ENCOUNTER — Ambulatory Visit (INDEPENDENT_AMBULATORY_CARE_PROVIDER_SITE_OTHER): Payer: Medicare Other

## 2022-01-21 DIAGNOSIS — Z0000 Encounter for general adult medical examination without abnormal findings: Secondary | ICD-10-CM

## 2022-01-21 NOTE — Progress Notes (Addendum)
Virtual Visit via Telephone Note  I connected with  Harold Moore on 01/21/22 at  1:30 PM EDT by telephone and verified that I am speaking with the correct person using two identifiers.  Medicare Annual Wellness visit completed telephonically due to Covid-19 pandemic.   Persons participating in this call: This Health Coach and this patient.   Location: Patient: Home Provider: office    I discussed the limitations, risks, security and privacy concerns of performing an evaluation and management service by telephone and the availability of in person appointments. The patient expressed understanding and agreed to proceed.  Unable to perform video visit due to video visit attempted and failed and/or patient does not have video capability.   Some vital signs may be absent or patient reported.   Harold Schlein, LPN   Subjective:   Harold Moore is a 76 y.o. male who presents for Medicare Annual/Subsequent preventive examination.  Review of Systems     Cardiac Risk Factors include: advanced age (>22men, >44 women);dyslipidemia;hypertension;male gender;obesity (BMI >30kg/m2)     Objective:    There were no vitals filed for this visit. There is no height or weight on file to calculate BMI.     01/21/2022    1:32 PM 11/17/2020    9:38 AM 10/19/2019    9:03 AM  Advanced Directives  Does Patient Have a Medical Advance Directive? Yes Yes Yes  Type of Estate agent of [place];Living will Healthcare Power of Attorney Living will;Healthcare Power of Attorney  Does patient want to make changes to medical advance directive?   No - Patient declined  Copy of Healthcare Power of Attorney in Chart? No - copy requested No - copy requested No - copy requested    Current Medications (verified) Outpatient Encounter Medications as of 01/21/2022  Medication Sig   amLODipine (NORVASC) 10 MG tablet TAKE 1 TABLET DAILY   aspirin EC 81 MG tablet Take 81 mg by mouth daily.   cetirizine  (ZYRTEC) 10 MG tablet Take 10 mg by mouth daily.   chlorthalidone (HYGROTON) 25 MG tablet TAKE 1 TABLET DAILY   lisinopril (ZESTRIL) 20 MG tablet TAKE 1 TABLET DAILY   metFORMIN (GLUCOPHAGE) 1000 MG tablet Take 1 tablet (1,000 mg total) by mouth 2 (two) times daily with a meal.   metoprolol succinate (TOPROL-XL) 50 MG 24 hr tablet TAKE 1 TABLET DAILY, WITH  OR IMMEDIATELY FOLLOWING A MEAL   Probiotic Product (PROBIOTIC PO) Take by mouth.   senna-docusate (SENOKOT-S) 8.6-50 MG tablet Take 1 tablet by mouth daily.   simvastatin (ZOCOR) 10 MG tablet TAKE 1 TABLET DAILY   No facility-administered encounter medications on file as of 01/21/2022.    Allergies (verified) Codeine   History: Past Medical History:  Diagnosis Date   Allergy    Arthritis    Hyperglycemia    Hyperlipidemia    Hypertension    Migraines    Positive PPD    Past Surgical History:  Procedure Laterality Date   APPENDECTOMY     SHOULDER SURGERY     TONSILLECTOMY     Family History  Problem Relation Age of Onset   Heart disease Mother    Stroke Mother    Hyperlipidemia Mother    Hypertension Mother    Heart disease Father    Prostate cancer Father    Hearing loss Sister    Hyperlipidemia Sister    Hypertension Sister    Heart disease Sister    Thyroid cancer Maternal Grandmother  Hearing loss Sister    Hearing loss Brother    Hyperlipidemia Brother    Colon cancer Neg Hx    Social History   Socioeconomic History   Marital status: Widowed    Spouse name: Not on file   Number of children: Not on file   Years of education: Not on file   Highest education level: Not on file  Occupational History   Not on file  Tobacco Use   Smoking status: Former    Types: Cigarettes    Quit date: 03/05/1972    Years since quitting: 49.9   Smokeless tobacco: Never  Vaping Use   Vaping Use: Never used  Substance and Sexual Activity   Alcohol use: Yes   Drug use: Never   Sexual activity: Not on file  Other  Topics Concern   Not on file  Social History Narrative   Not on file   Social Determinants of Health   Financial Resource Strain: Low Risk  (01/21/2022)   Overall Financial Resource Strain (CARDIA)    Difficulty of Paying Living Expenses: Not hard at all  Food Insecurity: No Food Insecurity (01/21/2022)   Hunger Vital Sign    Worried About Running Out of Food in the Last Year: Never true    Ran Out of Food in the Last Year: Never true  Transportation Needs: No Transportation Needs (01/21/2022)   PRAPARE - Administrator, Civil Service (Medical): No    Lack of Transportation (Non-Medical): No  Physical Activity: Sufficiently Active (01/21/2022)   Exercise Vital Sign    Days of Exercise per Week: 6 days    Minutes of Exercise per Session: 30 min  Stress: No Stress Concern Present (01/21/2022)   [employer] of Occupational Health - Occupational Stress Questionnaire    Feeling of Stress : Not at all  Social Connections: Moderately Isolated (01/21/2022)   Social Connection and Isolation Panel [NHANES]    Frequency of Communication with Friends and Family: More than three times a week    Frequency of Social Gatherings with Friends and Family: More than three times a week    Attends Religious Services: More than 4 times per year    Active Member of [employer] or Organizations: No    Attends Banker Meetings: Never    Marital Status: Widowed    Tobacco Counseling Counseling given: Not Answered   Clinical Intake:  Pre-visit preparation completed: Yes  Pain : No/denies pain     BMI - recorded: 32.64 Nutritional Status: BMI > 30  Obese Nutritional Risks: None Diabetes: No  How often do you need to have someone help you when you read instructions, pamphlets, or other written materials from your doctor or pharmacy?: 1 - Never  Diabetic?no  Interpreter Needed?: No  Information entered by :: Lanier Ensign, LPN   Activities of Daily Living     01/21/2022    1:33 PM  In your present state of health, do you have any difficulty performing the following activities:  Hearing? 1  Comment wears hearing aids  Vision? 0  Difficulty concentrating or making decisions? 0  Walking or climbing stairs? 1  Comment at times  Dressing or bathing? 0  Doing errands, shopping? 0  Preparing Food and eating ? N  Using the Toilet? N  In the past six months, have you accidently leaked urine? Y  Comment at times  Do you have problems with loss of bowel control? N  Managing your Medications? N  Managing your Finances? N  Housekeeping or managing your Housekeeping? N    Patient Care Team: Ardith Dark, MD as PCP - General (Family Medicine)  Indicate any recent Medical Services you may have received from other than [facility] providers in the past year (date may be approximate).     Assessment:   This is a routine wellness examination for [place].  Hearing/Vision screen Hearing Screening - Comments:: Pt wears a hearing aid  Vision Screening - Comments:: Pt follow up with [facility] for annual eye exams   Dietary issues and exercise activities discussed: Current Exercise Habits: Home exercise routine, Type of exercise: walking, Time (Minutes): 30, Frequency (Times/Week): 6, Weekly Exercise (Minutes/Week): 180   Goals Addressed             This Visit's Progress    Patient Stated       None at this time        Depression Screen    01/21/2022    1:31 PM 11/10/2021    2:34 PM 11/17/2020    9:37 AM 10/19/2019    9:05 AM 03/06/2019    9:38 AM  PHQ 2/9 Scores  PHQ - 2 Score 0 0 0 0 0    Fall Risk    01/21/2022    1:33 PM 11/10/2021    2:34 PM 11/17/2020    9:39 AM 10/19/2019    9:05 AM 10/19/2019    8:37 AM  Fall Risk   Falls in the past year? 0  0 0 0  Number falls in past yr: 0 0 0 0   Injury with Fall? 0 0 0 0   Risk for fall due to : Impaired vision;Impaired balance/gait No Fall Risks Impaired mobility;Impaired balance/gait;Impaired  vision    Risk for fall due to: Comment   related to vertigo at times    Follow up Falls prevention discussed  Falls prevention discussed Falls evaluation completed;Education provided;Falls prevention discussed     FALL RISK PREVENTION PERTAINING TO THE HOME:  Any stairs in or around the home? No  If so, are there any without handrails? No  Home free of loose throw rugs in walkways, pet beds, electrical cords, etc? Yes  Adequate lighting in your home to reduce risk of falls? Yes   ASSISTIVE DEVICES UTILIZED TO PREVENT FALLS:  Life alert? No  Use of a cane, walker or w/c? No  Grab bars in the bathroom? No  Shower chair or bench in shower? Yes  Elevated toilet seat or a handicapped toilet? No   TIMED UP AND GO:  Was the test performed? No .   Cognitive Function:        01/21/2022    1:34 PM 11/17/2020    9:42 AM 10/19/2019    9:05 AM  6CIT Screen  What Year? 0 points 0 points 0 points  What month? 0 points 0 points 0 points  What time? 0 points  0 points  Count back from 20 0 points 0 points 0 points  Months in reverse 0 points 0 points 0 points  Repeat phrase 0 points 0 points 0 points  Total Score 0 points  0 points    Immunizations Immunization History  Administered Date(s) Administered   Influenza-Unspecified 05/01/2019, 05/12/2020   PFIZER(Purple Top)SARS-COV-2 Vaccination 09/09/2019, 10/02/2019, 06/25/2020   PNEUMOCOCCAL CONJUGATE-20 11/10/2021   Zoster Recombinat (Shingrix) 07/25/2019, 02/05/2020      Flu Vaccine status: Due, Education has been provided regarding the importance of this vaccine. Advised may  receive this vaccine at local pharmacy or Health Dept. Aware to provide a copy of the vaccination record if obtained from local pharmacy or Health Dept. Verbalized acceptance and understanding.  Pneumococcal vaccine status: Up to date  Covid-19 vaccine status: Completed vaccines  Qualifies for Shingles Vaccine? Yes   Zostavax completed Yes   Shingrix  Completed?: Yes  Screening Tests Health Maintenance  Topic Date Due   Hepatitis C Screening  Never done   TETANUS/TDAP  Never done   COVID-19 Vaccine (4 - Booster for Pfizer series) 08/20/2020   INFLUENZA VACCINE  02/23/2022   Pneumonia Vaccine 49+ Years old  Completed   Zoster Vaccines- Shingrix  Completed   HPV VACCINES  Aged Out   COLONOSCOPY (Pts 45-70yrs Insurance coverage will need to be confirmed)  Discontinued    Health Maintenance  Health Maintenance Due  Topic Date Due   Hepatitis C Screening  Never done   TETANUS/TDAP  Never done   COVID-19 Vaccine (4 - Booster for Pfizer series) 08/20/2020    Colorectal cancer screening: No longer required.    Additional Screening:  Hepatitis C Screening: does qualify;   Vision Screening: Recommended annual ophthalmology exams for early detection of glaucoma and other disorders of the eye. Is the patient up to date with their annual eye exam?  Yes  Who is the provider or what is the name of the office in which the patient attends annual eye exams? [facility]  If pt is not established with a provider, would they like to be referred to a provider to establish care? No .   Dental Screening: Recommended annual dental exams for proper oral hygiene  Community Resource Referral / Chronic Care Management: CRR required this visit?  No   CCM required this visit?  No      Plan:     I have personally reviewed and noted the following in the patient's chart:   Medical and social history Use of alcohol, tobacco or illicit drugs  Current medications and supplements including opioid prescriptions. Patient is not currently taking opioid prescriptions. Functional ability and status Nutritional status Physical activity Advanced directives List of other physicians Hospitalizations, surgeries, and ER visits in previous 12 months Vitals Screenings to include cognitive, depression, and falls Referrals and appointments  In addition, I  have reviewed and discussed with patient certain preventive protocols, quality metrics, and best practice recommendations. A written personalized care plan for preventive services as well as general preventive health recommendations were provided to patient.     Harold Schlein, LPN   [id number]/49/1791   Nurse Notes: pt complains of muscle stiffness and right great toe has been burning and makes walking a challenge at times. Pt stated he would make appt to follow up with Dr Jimmey Ralph

## 2022-01-21 NOTE — Patient Instructions (Signed)
Harold Moore , Thank you for taking time to come for your Medicare Wellness Visit. I appreciate your ongoing commitment to your health goals. Please review the following plan we discussed and let me know if I can assist you in the future.   Screening recommendations/referrals: Colonoscopy: no longer required  Recommended yearly ophthalmology/optometry visit for glaucoma screening and checkup Recommended yearly dental visit for hygiene and checkup  Vaccinations: Influenza vaccine: Due  Pneumococcal vaccine: Up to date Tdap vaccine: due and discussed  Shingles vaccine: completed 07/25/19 & 07/11/20   Covid-19: Completed   Advanced directives: Please bring a copy of your health care power of attorney and living will to the office at your convenience.  Conditions/risks identified: none at this time   Next appointment: Follow up in one year for your annual wellness visit.  Preventive Care 67 Years and Older, Male Preventive care refers to lifestyle choices and visits with your health care provider that can promote health and wellness. What does preventive care include? A yearly physical exam. This is also called an annual well check. Dental exams once or twice a year. Routine eye exams. Ask your health care provider how often you should have your eyes checked. Personal lifestyle choices, including: Daily care of your teeth and gums. Regular physical activity. Eating a healthy diet. Avoiding tobacco and drug use. Limiting alcohol use. Practicing safe sex. Taking low doses of aspirin every day. Taking vitamin and mineral supplements as recommended by your health care provider. What happens during an annual well check? The services and screenings done by your health care provider during your annual well check will depend on your age, overall health, lifestyle risk factors, and family history of disease. Counseling  Your health care provider may ask you questions about your: Alcohol  use. Tobacco use. Drug use. Emotional well-being. Home and relationship well-being. Sexual activity. Eating habits. History of falls. Memory and ability to understand (cognition). Work and work Astronomer. Screening  You may have the following tests or measurements: Height, weight, and BMI. Blood pressure. Lipid and cholesterol levels. These may be checked every 5 years, or more frequently if you are over 39 years old. Skin check. Lung cancer screening. You may have this screening every year starting at age 96 if you have a 30-pack-year history of smoking and currently smoke or have quit within the past 15 years. Fecal occult blood test (FOBT) of the stool. You may have this test every year starting at age 56. Flexible sigmoidoscopy or colonoscopy. You may have a sigmoidoscopy every 5 years or a colonoscopy every 10 years starting at age 19. Prostate cancer screening. Recommendations will vary depending on your family history and other risks. Hepatitis C blood test. Hepatitis B blood test. Sexually transmitted disease (STD) testing. Diabetes screening. This is done by checking your blood sugar (glucose) after you have not eaten for a while (fasting). You may have this done every 1-3 years. Abdominal aortic aneurysm (AAA) screening. You may need this if you are a current or former smoker. Osteoporosis. You may be screened starting at age 47 if you are at high risk. Talk with your health care provider about your test results, treatment options, and if necessary, the need for more tests. Vaccines  Your health care provider may recommend certain vaccines, such as: Influenza vaccine. This is recommended every year. Tetanus, diphtheria, and acellular pertussis (Tdap, Td) vaccine. You may need a Td booster every 10 years. Zoster vaccine. You may need this after age 78.  Pneumococcal 13-valent conjugate (PCV13) vaccine. One dose is recommended after age 91. Pneumococcal polysaccharide  (PPSV23) vaccine. One dose is recommended after age 68. Talk to your health care provider about which screenings and vaccines you need and how often you need them. This information is not intended to replace advice given to you by your health care provider. Make sure you discuss any questions you have with your health care provider. Document Released: 08/08/2015 Document Revised: 03/31/2016 Document Reviewed: 05/13/2015 Elsevier Interactive Patient Education  2017 [employer] Prevention in the Home Falls can cause injuries. They can happen to people of all ages. There are many things you can do to make your home safe and to help prevent falls. What can I do on the outside of my home? Regularly fix the edges of walkways and driveways and fix any cracks. Remove anything that might make you trip as you walk through a door, such as a raised step or threshold. Trim any bushes or trees on the path to your home. Use bright outdoor lighting. Clear any walking paths of anything that might make someone trip, such as rocks or tools. Regularly check to see if handrails are loose or broken. Make sure that both sides of any steps have handrails. Any raised decks and porches should have guardrails on the edges. Have any leaves, snow, or ice cleared regularly. Use sand or salt on walking paths during winter. Clean up any spills in your garage right away. This includes oil or grease spills. What can I do in the bathroom? Use night lights. Install grab bars by the toilet and in the tub and shower. Do not use towel bars as grab bars. Use non-skid mats or decals in the tub or shower. If you need to sit down in the shower, use a plastic, non-slip stool. Keep the floor dry. Clean up any water that spills on the floor as soon as it happens. Remove soap buildup in the tub or shower regularly. Attach bath mats securely with double-sided non-slip rug tape. Do not have throw rugs and other things on the  floor that can make you trip. What can I do in the bedroom? Use night lights. Make sure that you have a light by your bed that is easy to reach. Do not use any sheets or blankets that are too big for your bed. They should not hang down onto the floor. Have a firm chair that has side arms. You can use this for support while you get dressed. Do not have throw rugs and other things on the floor that can make you trip. What can I do in the kitchen? Clean up any spills right away. Avoid walking on wet floors. Keep items that you use a lot in easy-to-reach places. If you need to reach something above you, use a strong step stool that has a grab bar. Keep electrical cords out of the way. Do not use floor polish or wax that makes floors slippery. If you must use wax, use non-skid floor wax. Do not have throw rugs and other things on the floor that can make you trip. What can I do with my stairs? Do not leave any items on the stairs. Make sure that there are handrails on both sides of the stairs and use them. Fix handrails that are broken or loose. Make sure that handrails are as long as the stairways. Check any carpeting to make sure that it is firmly attached to the stairs. Fix any  carpet that is loose or worn. Avoid having throw rugs at the top or bottom of the stairs. If you do have throw rugs, attach them to the floor with carpet tape. Make sure that you have a light switch at the top of the stairs and the bottom of the stairs. If you do not have them, ask someone to add them for you. What else can I do to help prevent falls? Wear shoes that: Do not have high heels. Have rubber bottoms. Are comfortable and fit you well. Are closed at the toe. Do not wear sandals. If you use a stepladder: Make sure that it is fully opened. Do not climb a closed stepladder. Make sure that both sides of the stepladder are locked into place. Ask someone to hold it for you, if possible. Clearly mark and make  sure that you can see: Any grab bars or handrails. First and last steps. Where the edge of each step is. Use tools that help you move around (mobility aids) if they are needed. These include: Canes. Walkers. Scooters. Crutches. Turn on the lights when you go into a dark area. Replace any light bulbs as soon as they burn out. Set up your furniture so you have a clear path. Avoid moving your furniture around. If any of your floors are uneven, fix them. If there are any pets around you, be aware of where they are. Review your medicines with your doctor. Some medicines can make you feel dizzy. This can increase your chance of falling. Ask your doctor what other things that you can do to help prevent falls. This information is not intended to replace advice given to you by your health care provider. Make sure you discuss any questions you have with your health care provider. Document Released: 05/08/2009 Document Revised: 12/18/2015 Document Reviewed: 08/16/2014 Elsevier Interactive Patient Education  2017 [employer].

## 2022-01-25 ENCOUNTER — Ambulatory Visit
Admission: RE | Admit: 2022-01-25 | Discharge: 2022-01-25 | Disposition: A | Discharge status: Home / Self Care | Payer: Medicare Other | Source: Ambulatory Visit | Attending: Cardiology | Admitting: Cardiology

## 2022-01-25 DIAGNOSIS — I712 Thoracic aortic aneurysm, without rupture, unspecified: Secondary | ICD-10-CM

## 2022-01-25 MED ORDER — IOPAMIDOL (ISOVUE-370) INJECTION 76%
75.0000 mL | Freq: Once | INTRAVENOUS | Status: AC | PRN
  Administered 2022-01-25: 75 mL via INTRAVENOUS

## 2022-01-28 ENCOUNTER — Encounter: Payer: Self-pay | Admitting: Family Medicine

## 2022-01-28 ENCOUNTER — Ambulatory Visit (INDEPENDENT_AMBULATORY_CARE_PROVIDER_SITE_OTHER): Payer: Medicare Other | Admitting: Family Medicine

## 2022-01-28 VITALS — BP 108/64 | HR 79 | Temp 97.9°F | Ht 68.0 in | Wt 213.2 lb

## 2022-01-28 DIAGNOSIS — I10 Essential (primary) hypertension: Secondary | ICD-10-CM

## 2022-01-28 DIAGNOSIS — M5416 Radiculopathy, lumbar region: Secondary | ICD-10-CM | POA: Insufficient documentation

## 2022-01-28 DIAGNOSIS — R739 Hyperglycemia, unspecified: Secondary | ICD-10-CM

## 2022-01-28 MED ORDER — GABAPENTIN 100 MG PO CAPS: 100.0000 mg | ORAL_CAPSULE | Freq: Every day | ORAL | 3 refills | Status: AC

## 2022-01-28 MED ORDER — METFORMIN HCL 1000 MG PO TABS
1000.0000 mg | ORAL_TABLET | Freq: Two times a day (BID) | ORAL | 0 refills | Status: DC
Start: 2022-01-28 — End: 2022-04-12

## 2022-01-28 MED ORDER — PREDNISONE 50 MG PO TABS: ORAL_TABLET | ORAL | 0 refills | Status: DC

## 2022-01-28 NOTE — Patient Instructions (Signed)
It was very nice to see you today!  I think you had irritation in the nerve coming out of your back.  Please start the prednisone and gabapentin.  Let us know if not improved by next week.  Take care, Dr Jimmey Ralph  PLEASE NOTE:  If you had any lab tests please let us know if you have not heard back within a few days. You may see your results on mychart before we have a chance to review them but we will give you a call once they are reviewed by [place]. If we ordered any referrals today, please let us know if you have not heard from their office within the next week.   Please try these tips to maintain a healthy lifestyle:  Eat at least 3 REAL meals and 1-2 snacks per day.  Aim for no more than 5 hours between eating.  If you eat breakfast, please do so within one hour of getting up.   Each meal should contain half fruits/vegetables, one quarter protein, and one quarter carbs (no bigger than a computer mouse)  Cut down on sweet beverages. This includes juice, soda, and sweet tea.   Drink at least 1 glass of water with each meal and aim for at least 8 glasses per day  Exercise at least 150 minutes every week.

## 2022-01-28 NOTE — Assessment & Plan Note (Signed)
Patient's current pain is in the L4 area.  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   He has likely had a flareup of his radiculopathy over the last couple weeks.  His exam today is reassuring.  No red flags.  We will start a course of prednisone and gabapentin.  Recommended he follow-up with physical medicine if not improving.  His last epidural steroid injection was done about 4 months ago.

## 2022-01-28 NOTE — Assessment & Plan Note (Signed)
Too early to recheck A1c.  Continue metformin 1000 mg twice daily.  We will recheck A1c in the next 3 to 6 months.

## 2022-01-28 NOTE — Assessment & Plan Note (Signed)
At goal today on amlodipine 10 mg daily, chlorthalidone 25 mg daily, lisinopril 20 mg daily, and metoprolol succinate 50 mg daily.

## 2022-01-28 NOTE — Progress Notes (Signed)
   Harold Moore is a 76 y.o. male who presents today for an office visit.  Assessment/Plan:  New/Acute Problems: Right Shoulder Pain No red flags.  Possibly mild rotator cuff tendinopathy.  Should have some improvement with prednisone as below.  Chronic Problems Addressed Today: Lumbar radiculopathy Patient's current pain is in the L4 area.  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   He has likely had a flareup of his radiculopathy over the last couple weeks.  His exam today is reassuring.  No red flags.  We will start a course of prednisone and gabapentin.  Recommended he follow-up with physical medicine if not improving.  His last epidural steroid injection was done about 4 months ago.  Hyperglycemia Too early to recheck A1c.  Continue metformin 1000 mg twice daily.  We will recheck A1c in the next 3 to 6 months.  Essential hypertension At goal today on amlodipine 10 mg daily, chlorthalidone 25 mg daily, lisinopril 20 mg daily, and metoprolol succinate 50 mg daily.     Subjective:  HPI:  Patient here with right toe and foot pain. Feels pins and needles when walking.  Started getting worse within the last month or so.  Does have a known history of lumbar radiculopathy and has been following with physical medicine for this.  Recently had epidural injection done about 4 months ago.  No recent falls or other injuries.  No precipitating events.  No specific treatments tried.  He has also noticed pain in his left shoulder.  This is going on for a few weeks.  No obvious injuries or precipitating events.  Does have a history of rotator cuff repair in the past.  No specific treatments tried.        Objective:  Physical Exam: BP 108/64   Pulse 79   Temp 97.9 F (36.6 C) (Temporal)   Ht 5\' 8"  (1.727 m)   Wt 213 lb 3.2 oz (96.7 kg)   SpO2 96%   BMI 32.42 kg/m   Gen: No acute distress, resting comfortably CV: Regular rate and  rhythm with no murmurs appreciated Pulm: Normal work of breathing, clear to auscultation bilaterally with no crackles, wheezes, or rhonchi MSK: - Arm: No deformities.  Tenderness palpation along anterior aspect of left shoulder.  Neer and Hawkins test positive.  Neurovascular intact distally - Back: No deformities.  Nontender to palpation - Right lower extremity: No deformities.  Full range of motion throughout.  Sensation light touch intact throughout.  Straight leg raise negative.  Tinel's sign negative at medial malleolus. Neuro: Grossly normal, moves all extremities Psych: Normal affect and thought content      Zabrina Brotherton M. , MD 01/28/2022 12:21 PM

## 2022-03-12 ENCOUNTER — Other Ambulatory Visit: Payer: Self-pay | Admitting: Family Medicine

## 2022-03-15 ENCOUNTER — Telehealth: Payer: Self-pay | Admitting: Physical Medicine and Rehabilitation

## 2022-03-15 NOTE — Telephone Encounter (Signed)
Patient called. He would like an appointment with Dr. Newton.  

## 2022-03-18 DIAGNOSIS — R9430 Abnormal result of cardiovascular function study, unspecified: Secondary | ICD-10-CM | POA: Insufficient documentation

## 2022-03-18 NOTE — Progress Notes (Signed)
Cardiology Office Note   Date:  03/19/2022   ID:  Harold Moore, DOB 02-04-1946, MRN [id number]  PCP:  Ardith Dark, MD  Cardiologist:   None   Chief Complaint  Patient presents with   Abnormal cardiovascular studies      History of Present Illness: Harold Moore is a 76 y.o. male who is referred by Ardith Dark, MD for evaluation of difficult to control HTN.  He has had carotid Dopplers and echocardiogram and what looks like an cardiac ultrasound in the past.  I did follow up with carotid Dopplers and he had mild plaque.    He had mild aortic enlargement on echo.   I repeated an echo in July and it shows it to be 3.9 cm.  Since I last saw him he has had no new cardiovascular problems.  He is limited a bit by his back hurting him.  He tries to golf and he is painting the inside of his house. The patient denies any new symptoms such as chest discomfort, neck or arm discomfort. There has been no new shortness of breath, PND or orthopnea. There have been no reported palpitations, presyncope or syncope.      Past Medical History:  Diagnosis Date   Allergy    Arthritis    Diabetes (HCC)    Hyperglycemia    Hyperlipidemia    Hypertension    Migraines    Positive PPD     Past Surgical History:  Procedure Laterality Date   APPENDECTOMY     SHOULDER SURGERY     TONSILLECTOMY       Current Outpatient Medications  Medication Sig Dispense Refill   amLODipine (NORVASC) 10 MG tablet TAKE 1 TABLET DAILY 30 tablet 0   aspirin EC 81 MG tablet Take 81 mg by mouth daily.     cetirizine (ZYRTEC) 10 MG tablet Take 10 mg by mouth daily.     chlorthalidone (HYGROTON) 25 MG tablet TAKE 1 TABLET DAILY 90 tablet 0   gabapentin (NEURONTIN) 100 MG capsule Take 1 capsule (100 mg total) by mouth at bedtime. 30 capsule 3   lisinopril (ZESTRIL) 20 MG tablet TAKE 1 TABLET DAILY 90 tablet 0   metFORMIN (GLUCOPHAGE) 1000 MG tablet Take 1 tablet (1,000 mg total) by mouth 2 (two) times daily with a  meal. 180 tablet 0   metoprolol succinate (TOPROL-XL) 50 MG 24 hr tablet TAKE 1 TABLET DAILY, WITH  OR IMMEDIATELY FOLLOWING A MEAL 30 tablet 0   Probiotic Product (PROBIOTIC PO) Take by mouth.     senna-docusate (SENOKOT-S) 8.6-50 MG tablet Take 1 tablet by mouth daily.     simvastatin (ZOCOR) 10 MG tablet TAKE 1 TABLET DAILY 30 tablet 0   No current facility-administered medications for this visit.    Allergies:   Codeine    ROS:  Please see the history of present illness.   Otherwise, review of systems are positive for none.   All other systems are reviewed and negative.    PHYSICAL EXAM: VS:  BP 121/69   Pulse 70   Ht 5\' 8"  (1.727 m)   Wt 214 lb (97.1 kg)   SpO2 100%   BMI 32.54 kg/m  , BMI Body mass index is 32.54 kg/m. GENERAL:  Well appearing NECK:  No jugular venous distention, waveform within normal limits, carotid upstroke brisk and symmetric, no bruits, no thyromegaly LUNGS:  Clear to auscultation bilaterally CHEST:  Unremarkable HEART:  PMI not displaced or  sustained,S1 and S2 within normal limits, no S3, no S4, no clicks, no rubs, no murmurs ABD:  Flat, positive bowel sounds normal in frequency in pitch, no bruits, no rebound, no guarding, no midline pulsatile mass, no hepatomegaly, no splenomegaly EXT:  2 plus pulses throughout, no edema, no cyanosis no clubbing   EKG:  EKG is  ordered today. Sinus rhythm, rate 70, axis within normal limits, intervals within normal limits, no acute ST-T wave changes.  Recent Labs: 11/10/2021: ALT 23; BUN 25; Creatinine, Ser 1.24; Hemoglobin 13.7; Platelets 208.0; Potassium 3.8; Sodium 139; TSH 1.82    Lipid Panel    Component Value Date/Time   CHOL 148 11/10/2021 1515   CHOL 158 03/04/2021 0815   TRIG 174.0 (H) 11/10/2021 1515   HDL 52.20 11/10/2021 1515   HDL 45 03/04/2021 0815   CHOLHDL 3 11/10/2021 1515   VLDL 34.8 11/10/2021 1515   LDLCALC 61 11/10/2021 1515   LDLCALC 88 03/04/2021 0815      Wt Readings from  Last 3 Encounters:  03/19/22 214 lb (97.1 kg)  01/28/22 213 lb 3.2 oz (96.7 kg)  11/10/21 214 lb 9.6 oz (97.3 kg)      Other studies Reviewed: Additional studies/ records that were reviewed today include: Labs, CT Review of the above records demonstrates: See elsewhere  ASSESSMENT AND PLAN:   HTN:   Blood pressures is at target.  No change in therapy.   DYSLIPIDEMIA: His LDL was 61 with an HDL of 52.  No change in therapy.   ABNORMAL CARDIOVASCULAR STUDY: He has mild aortic enlargement.  This was 39 mm.  I will follow this up again in 2 years.    DM: He has diabetes with an A1c of 7.4.  He and I talked at length about diet choices and alternatives for exercise that might not hurt his back.  I will defer management to Ardith Dark, MD  Current medicines are reviewed at length with the patient today.  The patient does not have concerns regarding medicines.  The following changes have been made: None  Labs/ tests ordered today include:    Orders Placed This Encounter  Procedures   EKG 12-Lead      Disposition:   FU with me in in 2 years after the CT.   Signed, Rollene Rotunda, MD  03/19/2022 4:06 PM    [facility]

## 2022-03-19 ENCOUNTER — Ambulatory Visit (INDEPENDENT_AMBULATORY_CARE_PROVIDER_SITE_OTHER): Payer: Medicare Other | Admitting: Cardiology

## 2022-03-19 ENCOUNTER — Encounter: Payer: Self-pay | Admitting: Cardiology

## 2022-03-19 VITALS — BP 121/69 | HR 70 | Ht 68.0 in | Wt 214.0 lb

## 2022-03-19 DIAGNOSIS — I10 Essential (primary) hypertension: Secondary | ICD-10-CM

## 2022-03-19 DIAGNOSIS — R9430 Abnormal result of cardiovascular function study, unspecified: Secondary | ICD-10-CM

## 2022-03-19 DIAGNOSIS — E785 Hyperlipidemia, unspecified: Secondary | ICD-10-CM

## 2022-03-19 NOTE — Patient Instructions (Signed)
Medication Instructions:  Your physician recommends that you continue on your current medications as directed. Please refer to the Current Medication list given to you today.  *If you need a refill on your cardiac medications before your next appointment, please call your pharmacy*    Testing/Procedures: Non-Cardiac CT Angiography (CTA), is a special type of CT scan that uses a computer to produce multi-dimensional views of major blood vessels throughout the body. In CT angiography, a contrast material is injected through an IV to help visualize the blood vessels Aorta- to be done in July 2025    Follow-Up: At [facility], you and your health needs are our priority.  As part of our continuing mission to provide you with exceptional heart care, we have created designated Provider Care Teams.  These Care Teams include your primary Cardiologist (physician) and Advanced Practice Providers (APPs -  Physician Assistants and Nurse Practitioners) who all work together to provide you with the care you need, when you need it.  We recommend signing up for the patient portal called "MyChart".  Sign up information is provided on this After Visit Summary.  MyChart is used to connect with patients for Virtual Visits (Telemedicine).  Patients are able to view lab/test results, encounter notes, upcoming appointments, etc.  Non-urgent messages can be sent to your provider as well.   To learn more about what you can do with MyChart, go to [email or web address].    Your next appointment:   2 year(s)  The format for your next appointment:   In Person  Provider:   Rollene Rotunda, MD

## 2022-04-01 ENCOUNTER — Ambulatory Visit: Payer: Medicare Other

## 2022-04-01 ENCOUNTER — Ambulatory Visit (INDEPENDENT_AMBULATORY_CARE_PROVIDER_SITE_OTHER): Payer: Medicare Other | Admitting: Physical Medicine and Rehabilitation

## 2022-04-01 ENCOUNTER — Encounter: Payer: Self-pay | Admitting: Physical Medicine and Rehabilitation

## 2022-04-01 VITALS — BP 143/84 | HR 80

## 2022-04-01 DIAGNOSIS — M5116 Intervertebral disc disorders with radiculopathy, lumbar region: Secondary | ICD-10-CM | POA: Diagnosis not present

## 2022-04-01 DIAGNOSIS — M47816 Spondylosis without myelopathy or radiculopathy, lumbar region: Secondary | ICD-10-CM | POA: Diagnosis not present

## 2022-04-01 DIAGNOSIS — M79674 Pain in right toe(s): Secondary | ICD-10-CM

## 2022-04-01 DIAGNOSIS — M7741 Metatarsalgia, right foot: Secondary | ICD-10-CM

## 2022-04-01 DIAGNOSIS — M5416 Radiculopathy, lumbar region: Secondary | ICD-10-CM | POA: Diagnosis not present

## 2022-04-01 MED ORDER — METHYLPREDNISOLONE ACETATE 80 MG/ML IJ SUSP
80.0000 mg | Freq: Once | INTRAMUSCULAR | Status: AC
  Administered 2022-04-01: 80 mg

## 2022-04-01 NOTE — Progress Notes (Signed)
Pt state lower back pain that travels to both hips. Pt state recently his right big toe has been causes him some pain.Pt state walking and standing for a long time makes the pain worse. Pt state he uses ice to help ease his pain.  Numeric Pain Rating Scale and Functional Assessment Average Pain 5   In the last MONTH (on 0-10 scale) has pain interfered with the following?  1. General activity like being  able to carry out your everyday physical activities such as walking, climbing stairs, carrying groceries, or moving a chair?  Rating(9)   +Driver, -BT, -Dye Allergies.

## 2022-04-01 NOTE — Patient Instructions (Signed)

## 2022-04-11 ENCOUNTER — Other Ambulatory Visit: Payer: Self-pay | Admitting: Family Medicine

## 2022-04-15 ENCOUNTER — Ambulatory Visit (INDEPENDENT_AMBULATORY_CARE_PROVIDER_SITE_OTHER): Payer: Medicare Other | Admitting: Orthopedic Surgery

## 2022-04-15 ENCOUNTER — Ambulatory Visit (INDEPENDENT_AMBULATORY_CARE_PROVIDER_SITE_OTHER): Payer: Medicare Other

## 2022-04-15 DIAGNOSIS — M79674 Pain in right toe(s): Secondary | ICD-10-CM

## 2022-04-15 DIAGNOSIS — M2021 Hallux rigidus, right foot: Secondary | ICD-10-CM

## 2022-04-19 ENCOUNTER — Encounter: Payer: Self-pay | Admitting: *Deleted

## 2022-04-19 NOTE — Progress Notes (Signed)
Harold Moore - 76 y.o. male MRN [id number]  Date of birth: [birth year]  Office Visit Note: Visit Date: 04/01/2022 PCP: [name], MD Referred by: [name], MD  Subjective: Chief Complaint  Patient presents with   Lower Back - Pain   Right Leg - Pain   Left Leg - Pain   HPI: Harold Moore is a 76 y.o. male who comes in today for reevaluation and management of chronic worsening severe low back pain and right more than left hip and leg pain.  Patient had MRI of the lumbar spine showing mainly facet hypertrophy at L4-5 with foraminal narrowing on the right more than left with disc protrusion foraminally.  In 2021 he saw Dr. Eduard Roux and then subsequently saw [place] for epidural injection.  Transforaminal injection did not offer much relief but epidural injection from interlaminar approach seem to help.  I have seen him another time since that time and completed epidural injection again that did help but it did not seem to last quite as long.  He reports his pain is quite severe but averages 5 out of 10.  It does affect his daily living.  Other than the injection has been using medication and heat and ice at times to help.  He still tries to stay active.  He has not noted any focal weakness or bowel or bladder difficulties or fevers chills or night sweats etc.  No history of prior lumbar surgery.  Has not had recent physical therapy but has had that in the past and continues with home exercise at times.  Secondary complaint today is significant pain in his great toe on the right.  This is more pain than numbness and tingling.  He was wondering if it was related to his spine.  Again his pain in his legs are more through the hips and not down past the knees to the feet.  No paresthesias no numbness in the foot.  Pain worse in the morning and with walking.      Review of Systems  Musculoskeletal:  Positive for back pain and joint pain.  All other systems reviewed and are negative.  Otherwise  per HPI.  Assessment & Plan: Visit Diagnoses:    ICD-10-CM   1. Lumbar radiculopathy  M54.16 XR C-ARM NO REPORT    Epidural Steroid injection    methylPREDNISolone acetate (DEPO-MEDROL) injection 80 mg    2. Spondylosis without myelopathy or radiculopathy, lumbar region  M47.816     3. Radiculopathy due to lumbar intervertebral disc disorder  M51.16     4. Great toe pain, right  M79.674 Ambulatory referral to Orthopedic Surgery    5. Metatarsalgia, right foot  M77.41 Ambulatory referral to Orthopedic Surgery       Plan: Findings:  1.  Low back and bilateral hip and leg pain still consistent with radicular pain from foraminal narrowing and some lateral recess narrowing but also could be related to facet arthropathy.  He has gotten good relief diagnostically with epidural injection and the first injection 22 1 seem to last quite a while.  We will repeat the epidural injection and monitor him.  Depending on relief would look at facet joint block versus regrouping with physical therapy or consideration of new MRI.  Would consider medication changes.  2.  More acute onset of right great toe pain that I do not think is related to his spine.  No paresthesias.  Tender along the metatarsal.  May have some type  of metatarsalgia or arthritic problem.  We will refer him to Dr. Meridee Score for further evaluation and management.    Meds & Orders:  Meds ordered this encounter  Medications   methylPREDNISolone acetate (DEPO-MEDROL) injection 80 mg    Orders Placed This Encounter  Procedures   XR C-ARM NO REPORT   Ambulatory referral to Orthopedic Surgery   Epidural Steroid injection    Follow-up: No follow-ups on file.   Procedures: No procedures performed  Lumbar Epidural Steroid Injection - Interlaminar Approach with Fluoroscopic Guidance  Patient: Harold Moore      Date of Birth: 02/08/1946 MRN: [id number] PCP: [name], MD      Visit Date: 04/01/2022   Universal Protocol:      Consent Given By: the patient  Position: PRONE  Additional Comments: Vital signs were monitored before and after the procedure. Patient was prepped and draped in the usual sterile fashion. The correct patient, procedure, and site was verified.   Injection Procedure Details:   Procedure diagnoses: Lumbar radiculopathy [M54.16]   Meds Administered:  Meds ordered this encounter  Medications   methylPREDNISolone acetate (DEPO-MEDROL) injection 80 mg     Laterality: Right  Location/Site:  L5-S1  Needle: 3.5 in., 20 ga. Tuohy  Needle Placement: Paramedian epidural  Findings:   -Comments: Excellent flow of contrast into the epidural space.  He seems to have somewhat of a lumbarized S1 segment.  Procedure Details: Using a paramedian approach from the side mentioned above, the region overlying the inferior lamina was localized under fluoroscopic visualization and the soft tissues overlying this structure were infiltrated with 4 ml. of 1% Lidocaine without Epinephrine. The Tuohy needle was inserted into the epidural space using a paramedian approach.   The epidural space was localized using loss of resistance along with counter oblique bi-planar fluoroscopic views.  After negative aspirate for air, blood, and CSF, a 2 ml. volume of Isovue-250 was injected into the epidural space and the flow of contrast was observed. Radiographs were obtained for documentation purposes.    The injectate was administered into the level noted above.   Additional Comments:  The patient tolerated the procedure well Dressing: 2 x 2 sterile gauze and Band-Aid    Post-procedure details: Patient was observed during the procedure. Post-procedure instructions were reviewed.  Patient left the clinic in stable condition.    Clinical History: MRI LUMBAR SPINE WITHOUT CONTRAST     TECHNIQUE:  Multiplanar, multisequence MR imaging of the lumbar spine was  performed. No intravenous contrast was  administered.     COMPARISON:  10/24/2019 lumbar spine radiographs.     FINDINGS:  Segmentation:  Normal.     Alignment:  Straightening of lumbar lordosis.  No listhesis.     Vertebrae:  No fracture, evidence of discitis, or bone lesion.     Conus medullaris and cauda equina: Conus extends to the L1 level.  Conus and cauda equina appear normal.     Paraspinal and other soft tissues: Negative.     Disc levels:     Multilevel osteophytosis with bridging ossification of the anterior  longitudinal ligament and Schmorl's node formation. Multilevel  desiccation with mild disc space loss.     T12-L1: No significant disc bulge, spinal canal or neural foraminal  narrowing.     L1-2: Disc bulge, ligamentum flavum and bilateral facet hypertrophy.  Mild bilateral neural foraminal narrowing.     L2-3: Disc bulge, ligamentum flavum and bilateral facet hypertrophy.  Mild bilateral neural foraminal  narrowing.     L3-4: Disc bulge with superimposed central protrusion, ligamentum  flavum and bilateral facet hypertrophy. Mild spinal canal and severe  bilateral neural foraminal narrowing.     L4-5: Disc bulge with superimposed right foraminal  protrusion/annular fissuring (7:25) abutting the exiting right L4  nerve root, ligamentum flavum and bilateral facet hypertrophy. Mild  spinal canal, severe right and moderate left neural foraminal  narrowing.     L5-S1: No significant disc bulge, spinal canal or neural foraminal  narrowing.     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     Moderate to severe bilateral neural foraminal narrowing at the L3-4  and L4-5 levels.     Right L4-5 foraminal protrusion abutting the exiting right L4 nerve  root.     Mild L3-5 spinal canal narrowing.        Electronically Signed    By: Primitivo Gauze M.D.    On: 11/24/2019 13:14   He reports that he quit smoking about 50 years ago. His  smoking use included cigarettes. He has never used smokeless tobacco.  Recent Labs    11/10/21 1515  HGBA1C 7.4*    Objective:  VS:  HT:    WT:   BMI:     BP: (!) 143/84  HR:80bpm  TEMP: ( )  RESP:  Physical Exam Vitals and nursing note reviewed.  Constitutional:      General: He is not in acute distress.    Appearance: Normal appearance. He is not ill-appearing.  HENT:     Head: Normocephalic and atraumatic.     Right Ear: External ear normal.     Left Ear: External ear normal.     Nose: No congestion.  Eyes:     Extraocular Movements: Extraocular movements intact.  Cardiovascular:     Rate and Rhythm: Normal rate.     Pulses: Normal pulses.  Pulmonary:     Effort: Pulmonary effort is normal. No respiratory distress.  Abdominal:     General: There is no distension.     Palpations: Abdomen is soft.  Musculoskeletal:        General: Tenderness present. No signs of injury.     Cervical back: Neck supple.     Right lower leg: No edema.     Left lower leg: No edema.     Comments: Patient has good distal strength without clonus.  Mild pain over the greater trochanters.  No pain with hip rotation.  He does have concordant back pain with facet loading and extension.  Negative slump test bilaterally.  Examination of the right great toe does not show any swelling or discoloration or rash.  It is tender to palpation along the metatarsal.  Skin:    Findings: No erythema or rash.  Neurological:     General: No focal deficit present.     Mental Status: He is alert and oriented to person, place, and time.     Sensory: No sensory deficit.     Motor: No weakness or abnormal muscle tone.     Coordination: Coordination normal.     Gait: Gait abnormal.  Psychiatric:        Mood and Affect: Mood normal.        Behavior: Behavior normal.     Ortho Exam  Imaging: No results found.  Past Medical/Family/Surgical/Social History: Medications & Allergies reviewed per EMR, new  medications updated. 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   Essential hypertension 03/06/2019   Hyperglycemia 03/06/2019   Dyslipidemia 03/06/2019   Allergic rhinitis 03/06/2019   Osteoarthritis 03/06/2019   Past Medical History:  Diagnosis Date   Allergy    Arthritis    Diabetes ([place])    Hyperglycemia    Hyperlipidemia    Hypertension    Migraines    Positive PPD    Family History  Problem Relation Age of Onset   Heart disease Mother    Stroke Mother    Hyperlipidemia Mother    Hypertension Mother    Heart disease Father    Prostate cancer Father    Hearing loss Sister    Hyperlipidemia Sister    Hypertension Sister    Heart disease Sister    Thyroid cancer Maternal Grandmother    Hearing loss Sister    Hearing loss Brother    Hyperlipidemia Brother    Colon cancer Neg Hx    Past Surgical History:  Procedure Laterality Date   APPENDECTOMY     SHOULDER SURGERY     TONSILLECTOMY     Social History   Occupational History   Not on file  Tobacco Use   Smoking status: Former    Types: Cigarettes    Quit date: 03/05/1972    Years since quitting: 50.1   Smokeless tobacco: Never  Vaping Use   Vaping Use: Never used  Substance and Sexual Activity   Alcohol use: Yes   Drug use: Never   Sexual activity: Not on file

## 2022-04-19 NOTE — Procedures (Signed)
Lumbar Epidural Steroid Injection - Interlaminar Approach with Fluoroscopic Guidance  Patient: Harold Moore      Date of Birth: October 17, 1945 MRN: [id number] PCP: [name], MD      Visit Date: 04/01/2022   Universal Protocol:     Consent Given By: the patient  Position: PRONE  Additional Comments: Vital signs were monitored before and after the procedure. Patient was prepped and draped in the usual sterile fashion. The correct patient, procedure, and site was verified.   Injection Procedure Details:   Procedure diagnoses: Lumbar radiculopathy [M54.16]   Meds Administered:  Meds ordered this encounter  Medications   methylPREDNISolone acetate (DEPO-MEDROL) injection 80 mg     Laterality: Right  Location/Site:  L5-S1  Needle: 3.5 in., 20 ga. Tuohy  Needle Placement: Paramedian epidural  Findings:   -Comments: Excellent flow of contrast into the epidural space.  He seems to have somewhat of a lumbarized S1 segment.  Procedure Details: Using a paramedian approach from the side mentioned above, the region overlying the inferior lamina was localized under fluoroscopic visualization and the soft tissues overlying this structure were infiltrated with 4 ml. of 1% Lidocaine without Epinephrine. The Tuohy needle was inserted into the epidural space using a paramedian approach.   The epidural space was localized using loss of resistance along with counter oblique bi-planar fluoroscopic views.  After negative aspirate for air, blood, and CSF, a 2 ml. volume of Isovue-250 was injected into the epidural space and the flow of contrast was observed. Radiographs were obtained for documentation purposes.    The injectate was administered into the level noted above.   Additional Comments:  The patient tolerated the procedure well Dressing: 2 x 2 sterile gauze and Band-Aid    Post-procedure details: Patient was observed during the procedure. Post-procedure instructions were  reviewed.  Patient left the clinic in stable condition.

## 2022-04-25 ENCOUNTER — Encounter: Payer: Self-pay | Admitting: Orthopedic Surgery

## 2022-04-25 NOTE — Progress Notes (Signed)
Office Visit Note   Patient: Harold Moore           Date of Birth: 1946/06/15           MRN: [id number] Visit Date: 04/15/2022              Requested by: Ardith Dark, MD [address] [place],  [place] [address] PCP: Ardith Dark, MD  Chief Complaint  Patient presents with   Right Foot - Pain      HPI: Patient is a 76 year old gentleman who presents with several month history of right great toe MTP joint pain.  Patient denies any injury denies any redness or swelling.  Patient is status post epidural steroid injections.  Assessment & Plan: Visit Diagnoses:  1. Great toe pain, right   2. Hallux rigidus, right foot     Plan: Recommended a carbon plate or stiff soled shoe versus fusion.  Patient will proceed with conservative therapy.  Follow-Up Instructions: No follow-ups on file.   Ortho Exam  Patient is alert, oriented, no adenopathy, well-dressed, normal affect, normal respiratory effort. Examination patient is a good dorsalis pedis and posterior tibial pulse there are osteophytic bone spurs of the MTP joint of the great toe tenderness to palpation he has dorsiflexion of the great toe of 20 degrees plantarflexion of 0 degrees  Imaging: No results found. No images are attached to the encounter.  Labs: Lab Results  Component Value Date   HGBA1C 7.4 (H) 11/10/2021   HGBA1C 6.8 (A) 10/19/2019   HGBA1C 6.5 03/06/2019     Lab Results  Component Value Date   ALBUMIN 4.5 11/10/2021   ALBUMIN 4.6 03/06/2019    No results found for: "MG" No results found for: "VD25OH"  No results found for: "PREALBUMIN"    Latest Ref Rng & Units 11/10/2021    3:15 PM 03/04/2021    8:15 AM 03/06/2019   10:14 AM  CBC EXTENDED  WBC 4.0 - 10.5 K/uL 4.9  4.2  4.8   RBC 4.22 - 5.81 Mil/uL 3.95  3.94  4.19   Hemoglobin 13.0 - 17.0 g/dL 78.2  42.3  53.6   HCT 39.0 - 52.0 % 39.7  38.2  41.3   Platelets 150.0 - 400.0 K/uL 208.0  213  228.0      There is no height or weight on  file to calculate BMI.  Orders:  Orders Placed This Encounter  Procedures   XR Toe Great Right   No orders of the defined types were placed in this encounter.    Procedures: No procedures performed  Clinical Data: No additional findings.  ROS:  All other systems negative, except as noted in the HPI. Review of Systems  Objective: Vital Signs: There were no vitals taken for this visit.  Specialty Comments:  MRI LUMBAR SPINE WITHOUT CONTRAST     TECHNIQUE:  Multiplanar, multisequence MR imaging of the lumbar spine was  performed. No intravenous contrast was administered.     COMPARISON:  10/24/2019 lumbar spine radiographs.     FINDINGS:  Segmentation:  Normal.     Alignment:  Straightening of lumbar lordosis.  No listhesis.     Vertebrae:  No fracture, evidence of discitis, or bone lesion.     Conus medullaris and cauda equina: Conus extends to the L1 level.  Conus and cauda equina appear normal.     Paraspinal and other soft tissues: Negative.     Disc levels:     Multilevel osteophytosis  with bridging ossification of the anterior  longitudinal ligament and Schmorl's node formation. Multilevel  desiccation with mild disc space loss.     T12-L1: No significant disc bulge, spinal canal or neural foraminal  narrowing.     L1-2: Disc bulge, ligamentum flavum and bilateral facet hypertrophy.  Mild bilateral neural foraminal narrowing.     L2-3: Disc bulge, ligamentum flavum and bilateral facet hypertrophy.  Mild bilateral neural foraminal narrowing.     L3-4: Disc bulge with superimposed central protrusion, ligamentum  flavum and bilateral facet hypertrophy. Mild spinal canal and severe  bilateral neural foraminal narrowing.     L4-5: Disc bulge with superimposed right foraminal  protrusion/annular fissuring (7:25) abutting the exiting right L4  nerve root, ligamentum flavum and bilateral facet hypertrophy. Mild  spinal canal, severe right and moderate  left neural foraminal  narrowing.     L5-S1: No significant disc bulge, spinal canal or neural foraminal  narrowing.     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     Moderate to severe bilateral neural foraminal narrowing at the L3-4  and L4-5 levels.     Right L4-5 foraminal protrusion abutting the exiting right L4 nerve  root.     Mild L3-5 spinal canal narrowing.        Electronically Signed    By: Primitivo Gauze M.D.    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   Lumbar foraminal stenosis 12/05/2019   Essential hypertension 03/06/2019   Hyperglycemia 03/06/2019   Dyslipidemia 03/06/2019   Allergic rhinitis 03/06/2019   Osteoarthritis 03/06/2019   Past Medical History:  Diagnosis Date   Allergy    Arthritis    Diabetes ([place])    Hyperglycemia    Hyperlipidemia    Hypertension    Migraines    Positive PPD     Family History  Problem Relation Age of Onset   Heart disease Mother    Stroke Mother    Hyperlipidemia Mother    Hypertension Mother    Heart disease Father    Prostate cancer Father    Hearing loss Sister    Hyperlipidemia Sister    Hypertension Sister    Heart disease Sister    Thyroid cancer Maternal Grandmother    Hearing loss Sister    Hearing loss Brother    Hyperlipidemia Brother    Colon cancer Neg Hx     Past Surgical History:  Procedure Laterality Date   APPENDECTOMY     SHOULDER SURGERY     TONSILLECTOMY     Social History   Occupational History   Not on file  Tobacco Use   Smoking status: Former    Types: Cigarettes    Quit date: 03/05/1972    Years since quitting: 50.1   Smokeless tobacco: Never  Vaping Use   Vaping Use: Never used  Substance and Sexual Activity    Alcohol use: Yes   Drug use: Never   Sexual activity: Not on file

## 2022-05-24 ENCOUNTER — Other Ambulatory Visit: Payer: Self-pay | Admitting: Family Medicine

## 2022-06-23 ENCOUNTER — Other Ambulatory Visit: Payer: Self-pay | Admitting: Family Medicine

## 2022-07-08 ENCOUNTER — Encounter: Payer: Self-pay | Admitting: *Deleted

## 2022-08-04 ENCOUNTER — Telehealth: Payer: Self-pay | Admitting: Family Medicine

## 2022-08-04 NOTE — Telephone Encounter (Signed)
Patient states: - Just changed insurance so his new pharmacy will be changing to [facility].   Patient requests medications below:   MEDICATION: amLODipine (NORVASC) 10 MG tablet  metFORMIN (GLUCOPHAGE) 1000 MG tablet   Is the patient out of medication? Will be out soon  PHARMACY: [place] [address] Phone: [phone]

## 2022-08-05 ENCOUNTER — Other Ambulatory Visit: Payer: Self-pay | Admitting: *Deleted

## 2022-08-06 ENCOUNTER — Other Ambulatory Visit: Payer: Self-pay | Admitting: *Deleted

## 2022-08-06 MED ORDER — METFORMIN HCL 1000 MG PO TABS: 1000.0000 mg | ORAL_TABLET | Freq: Two times a day (BID) | ORAL | 0 refills | Status: DC

## 2022-08-06 MED ORDER — AMLODIPINE BESYLATE 10 MG PO TABS: 10.0000 mg | ORAL_TABLET | Freq: Every day | ORAL | 0 refills | Status: DC

## 2022-08-06 NOTE — Telephone Encounter (Signed)
Rx send to  [facility]

## 2022-08-17 NOTE — Telephone Encounter (Signed)
Pt is needing these medications sent as generic. Insurance will not cover the name brand. Please advise

## 2022-08-19 NOTE — Telephone Encounter (Signed)
Both Rx was send as generic

## 2022-09-20 ENCOUNTER — Encounter: Payer: Self-pay | Admitting: Physical Medicine and Rehabilitation

## 2022-09-21 ENCOUNTER — Ambulatory Visit (INDEPENDENT_AMBULATORY_CARE_PROVIDER_SITE_OTHER): Payer: Medicare Other | Admitting: Family Medicine

## 2022-09-21 ENCOUNTER — Encounter: Payer: Self-pay | Admitting: Family Medicine

## 2022-09-21 VITALS — BP 117/69 | HR 60 | Temp 98.8°F | Ht 68.0 in | Wt 210.0 lb

## 2022-09-21 DIAGNOSIS — I10 Essential (primary) hypertension: Secondary | ICD-10-CM

## 2022-09-21 DIAGNOSIS — R739 Hyperglycemia, unspecified: Secondary | ICD-10-CM | POA: Diagnosis not present

## 2022-09-21 MED ORDER — SIMVASTATIN 10 MG PO TABS: 10.0000 mg | ORAL_TABLET | Freq: Every day | ORAL | 1 refills | Status: DC

## 2022-09-21 MED ORDER — CHLORTHALIDONE 25 MG PO TABS: 25.0000 mg | ORAL_TABLET | Freq: Every day | ORAL | 1 refills | Status: DC

## 2022-09-21 MED ORDER — LISINOPRIL 20 MG PO TABS: 20.0000 mg | ORAL_TABLET | Freq: Every day | ORAL | 1 refills | Status: DC

## 2022-09-21 MED ORDER — METOPROLOL SUCCINATE ER 50 MG PO TB24: 50.0000 mg | ORAL_TABLET | Freq: Every day | ORAL | 1 refills | Status: DC

## 2022-09-21 NOTE — Assessment & Plan Note (Signed)
Blood pressure at goal on amlodipine 10 mg daily, chlorthalidone 25 mg daily, lisinopril 20 mg daily, metoprolol succinate 50 mg daily.

## 2022-09-21 NOTE — Progress Notes (Signed)
   Casten Gregorich is a 77 y.o. male who presents today for an office visit.  Assessment/Plan:  New/Acute Problems: Diarrhea No red flags.  Reassuring exam. Likely medication side effect of metformin.  We will stop this medication and he can follow-up with me in 1 to 2 weeks.  It is okay for him to continue probiotics and fiber supplementation.  If still no improvement off of the metformin we will look for other causes at that time including labs and potential stool studies.  We discussed reasons return to care.  Chronic Problems Addressed Today: Hyperglycemia Likely having some GI issues with the metformin.  We will be stopping as above.  Discussed with patient that we should have him come back soon to check A1c.    We will see how he does off of metformin first before checking A1c.  If metformin is not causing any issues with diarrhea we will obviously have him restart and potentially add on medications as needed.  If the metformin does seem to be the reason for his diarrhea we can then discuss lowering dose versus trial of alternative medication.  Regardless, we will need to have him come back soon within the next few weeks to recheck A1c.   Essential hypertension Blood pressure at goal on amlodipine 10 mg daily, chlorthalidone 25 mg daily, lisinopril 20 mg daily, metoprolol succinate 50 mg daily.     Subjective:  HPI:  See A/p for status of chronic conditions.  Main concern today is diarrhea. This started about a month ago. Happens a couple of times per week. At first he thought it was due to what he was eating. Tried changing his diet without much improvement. Sometimes gets cramping. No hematochezia. No nausea or vomiting. He has been taking more probiotics which seems to help.  No fevers or chills.        Objective:  Physical Exam: BP 117/69   Pulse 60   Temp 98.8 F (37.1 C)   Ht 5' 8"$  (1.727 m)   Wt 210 lb (95.3 kg)   SpO2 100%   BMI 31.93 kg/m   Wt Readings from Last 3  Encounters:  09/21/22 210 lb (95.3 kg)  03/19/22 214 lb (97.1 kg)  01/28/22 213 lb 3.2 oz (96.7 kg)  Gen: No acute distress, resting comfortably CV: Regular rate and rhythm with no murmurs appreciated Pulm: Normal work of breathing, clear to auscultation bilaterally with no crackles, wheezes, or rhonchi GI: Bowel sounds present, S, NT, ND Neuro: Grossly normal, moves all extremities Psych: Normal affect and thought content      Sevannah Madia M. Jerline Pain, MD 09/21/2022 12:48 PM

## 2022-09-21 NOTE — Assessment & Plan Note (Signed)
Likely having some GI issues with the metformin.  We will be stopping as above.  Discussed with patient that we should have him come back soon to check A1c.    We will see how he does off of metformin first before checking A1c.  If metformin is not causing any issues with diarrhea we will obviously have him restart and potentially add on medications as needed.  If the metformin does seem to be the reason for his diarrhea we can then discuss lowering dose versus trial of alternative medication.  Regardless, we will need to have him come back soon within the next few weeks to recheck A1c.

## 2022-09-21 NOTE — Patient Instructions (Signed)
It was very nice to see you today!  Your diarrhea is probably coming from metformin.  Please stop this medication.  Send me a message in 1 to 2 weeks to let me know how you are doing and we can discuss next steps at that point.  Will refill your other medications today.  Take care, Dr Jerline Pain  PLEASE NOTE:  If you had any lab tests, please let us know if you have not heard back within a few days. You may see your results on mychart before we have a chance to review them but we will give you a call once they are reviewed by [place].   If we ordered any referrals today, please let us know if you have not heard from their office within the next week.   If you had any urgent prescriptions sent in today, please check with the pharmacy within an hour of our visit to make sure the prescription was transmitted appropriately.   Please try these tips to maintain a healthy lifestyle:  Eat at least 3 REAL meals and 1-2 snacks per day.  Aim for no more than 5 hours between eating.  If you eat breakfast, please do so within one hour of getting up.   Each meal should contain half fruits/vegetables, one quarter protein, and one quarter carbs (no bigger than a computer mouse)  Cut down on sweet beverages. This includes juice, soda, and sweet tea.   Drink at least 1 glass of water with each meal and aim for at least 8 glasses per day  Exercise at least 150 minutes every week.

## 2022-09-22 ENCOUNTER — Other Ambulatory Visit: Payer: Self-pay | Admitting: Physical Medicine and Rehabilitation

## 2022-09-22 DIAGNOSIS — M7061 Trochanteric bursitis, right hip: Secondary | ICD-10-CM

## 2022-09-27 ENCOUNTER — Ambulatory Visit (INDEPENDENT_AMBULATORY_CARE_PROVIDER_SITE_OTHER): Payer: Medicare Other | Admitting: Physical Medicine and Rehabilitation

## 2022-09-27 ENCOUNTER — Ambulatory Visit: Payer: Self-pay

## 2022-09-27 DIAGNOSIS — M7061 Trochanteric bursitis, right hip: Secondary | ICD-10-CM | POA: Diagnosis not present

## 2022-09-27 NOTE — Progress Notes (Unsigned)
Harold Moore - 77 y.o. male MRN [id number]  Date of birth: 1946/03/16  Office Visit Note: Visit Date: 09/27/2022 PCP: [name], MD Referred by: [name], MD  Subjective: Chief Complaint  Patient presents with   Right Hip - Pain   HPI:  Harold Moore is a 77 y.o. male who comes in todayHPI ROS Otherwise per HPI.  Assessment & Plan: Visit Diagnoses: No diagnosis found.  Plan: No additional findings.   Meds & Orders: No orders of the defined types were placed in this encounter.  No orders of the defined types were placed in this encounter.   Follow-up: No follow-ups on file.   Procedures: Large Joint Inj: R greater trochanter on 09/27/2022 8:16 AM Indications: pain and diagnostic evaluation Details: 22 G 3.5 in needle, fluoroscopy-guided lateral approach  Arthrogram: No  Medications: 4 mL lidocaine 2 %; 4 mL bupivacaine 0.25 %; 40 mg triamcinolone acetonide 40 MG/ML Outcome: tolerated well, no immediate complications  There was excellent flow of contrast outlined the greater trochanteric bursa without vascular uptake. Procedure, treatment alternatives, risks and benefits explained, specific risks discussed. Consent was given by the patient. Immediately prior to procedure a time out was called to verify the correct patient, procedure, equipment, support staff and site/side marked as required. Patient was prepped and draped in the usual sterile fashion.          Clinical History: MRI LUMBAR SPINE WITHOUT CONTRAST     TECHNIQUE:  Multiplanar, multisequence MR imaging of the lumbar spine was  performed. No intravenous contrast was administered.     COMPARISON:  10/24/2019 lumbar spine radiographs.     FINDINGS:  Segmentation:  Normal.     Alignment:  Straightening of lumbar lordosis.  No listhesis.     Vertebrae:  No fracture, evidence of discitis, or bone lesion.     Conus medullaris and cauda equina: Conus extends to the L1 level.  Conus and cauda equina  appear normal.     Paraspinal and other soft tissues: Negative.     Disc levels:     Multilevel osteophytosis with bridging ossification of the anterior  longitudinal ligament and Schmorl's node formation. Multilevel  desiccation with mild disc space loss.     T12-L1: No significant disc bulge, spinal canal or neural foraminal  narrowing.     L1-2: Disc bulge, ligamentum flavum and bilateral facet hypertrophy.  Mild bilateral neural foraminal narrowing.     L2-3: Disc bulge, ligamentum flavum and bilateral facet hypertrophy.  Mild bilateral neural foraminal narrowing.     L3-4: Disc bulge with superimposed central protrusion, ligamentum  flavum and bilateral facet hypertrophy. Mild spinal canal and severe  bilateral neural foraminal narrowing.     L4-5: Disc bulge with superimposed right foraminal  protrusion/annular fissuring (7:25) abutting the exiting right L4  nerve root, ligamentum flavum and bilateral facet hypertrophy. Mild  spinal canal, severe right and moderate left neural foraminal  narrowing.     L5-S1: No significant disc bulge, spinal canal or neural foraminal  narrowing.     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     Moderate to severe bilateral neural foraminal narrowing at the L3-4  and L4-5 levels.     Right L4-5 foraminal protrusion abutting the exiting right L4 nerve  root.     Mild L3-5 spinal canal narrowing.        Electronically Signed    By: Milus Mallick.D.  On: 11/24/2019 13:14     Objective:  VS:  HT:    WT:   BMI:     BP:   HR: bpm  TEMP: ( )  RESP:  Physical Exam   Imaging: No results found.

## 2022-09-27 NOTE — Progress Notes (Unsigned)
Functional Pain Scale - descriptive words and definitions  Distressing (6)    Pain is present/unable to complete most ADLs limited by pain/sleep is difficult and active distraction is only marginal. Moderate range order  Average Pain 6   +Driver, -BT, -Dye Allergies.  Right hip pain

## 2022-09-28 MED ORDER — LIDOCAINE HCL 2 % IJ SOLN
4.0000 mL | INTRAMUSCULAR | Status: AC | PRN
  Administered 2022-09-27: 4 mL

## 2022-09-28 MED ORDER — BUPIVACAINE HCL 0.25 % IJ SOLN
4.0000 mL | INTRAMUSCULAR | Status: AC | PRN
  Administered 2022-09-27: 4 mL via INTRA_ARTICULAR

## 2022-09-28 MED ORDER — TRIAMCINOLONE ACETONIDE 40 MG/ML IJ SUSP
40.0000 mg | INTRAMUSCULAR | Status: AC | PRN
  Administered 2022-09-27: 40 mg via INTRA_ARTICULAR

## 2022-10-04 ENCOUNTER — Encounter: Payer: Self-pay | Admitting: Family Medicine

## 2022-10-04 NOTE — Telephone Encounter (Signed)
See note

## 2022-10-05 NOTE — Telephone Encounter (Signed)
I am glad he is doing better. Recommend he come back soon to recheck A1c before we decide on alternative medications.  Algis Greenhouse. Jerline Pain, MD 10/05/2022 1:02 PM

## 2022-10-08 ENCOUNTER — Ambulatory Visit (INDEPENDENT_AMBULATORY_CARE_PROVIDER_SITE_OTHER): Payer: Medicare Other | Admitting: Family Medicine

## 2022-10-08 ENCOUNTER — Telehealth: Payer: Self-pay | Admitting: Family Medicine

## 2022-10-08 ENCOUNTER — Encounter: Payer: Self-pay | Admitting: Family Medicine

## 2022-10-08 VITALS — BP 133/76 | HR 64 | Temp 97.3°F | Ht 68.0 in | Wt 207.2 lb

## 2022-10-08 DIAGNOSIS — R739 Hyperglycemia, unspecified: Secondary | ICD-10-CM

## 2022-10-08 DIAGNOSIS — E1169 Type 2 diabetes mellitus with other specified complication: Secondary | ICD-10-CM | POA: Diagnosis not present

## 2022-10-08 DIAGNOSIS — I10 Essential (primary) hypertension: Secondary | ICD-10-CM | POA: Diagnosis not present

## 2022-10-08 LAB — POCT GLYCOSYLATED HEMOGLOBIN (HGB A1C): Hemoglobin A1C: 7 % — AB (ref 4.0–5.6)

## 2022-10-08 MED ORDER — TIRZEPATIDE 2.5 MG/0.5ML ~~LOC~~ SOAJ: 2.5000 mg | SUBCUTANEOUS | 0 refills | Status: DC

## 2022-10-08 NOTE — Telephone Encounter (Signed)
Pt state the pharmacy that the RX was sent  tirzepatide [facility]) 2.5 MG/0.5ML Pen  Does not have it, can you resend to [facility] [place]   Please advise and call pt back with details.

## 2022-10-08 NOTE — Assessment & Plan Note (Signed)
Blood pressure at goal today on amlodipine 10 mg daily, chlorthalidone 25 mg daily, lisinopril 20 mg daily, and metoprolol succinate 50 mg daily.

## 2022-10-08 NOTE — Assessment & Plan Note (Signed)
A1c today 7.0.  He was having diarrhea with metformin and we stopped this a few weeks ago.  We discussed alternative treatment options.  Believe he would do well with a GLP-1 agonist.  We discussed potential side effects.  We will start Mounjaro 2.5 mg weekly for 4 weeks.  He will send me a message in a few weeks to let me know how he is doing and we can titrate the dose as needed.  We will recheck A1c in 3 months.

## 2022-10-08 NOTE — Progress Notes (Signed)
   Harold Moore is a 77 y.o. male who presents today for an office visit.  Assessment/Plan:  Chronic Problems Addressed Today: T2DM (type 2 diabetes mellitus) (H[facility]) A1c today 7.0.  He was having diarrhea with metformin and we stopped this a few weeks ago.  We discussed alternative treatment options.  Believe he would do well with a GLP-1 agonist.  We discussed potential side effects.  We will start Mounjaro 2.5 mg weekly for 4 weeks.  He will send me a message in a few weeks to let me know how he is doing and we can titrate the dose as needed.  We will recheck A1c in 3 months.  Essential hypertension Blood pressure at goal today on amlodipine 10 mg daily, chlorthalidone 25 mg daily, lisinopril 20 mg daily, and metoprolol succinate 50 mg daily.     Subjective:  HPI:  See A/P for status of chronic conditions.  Patient is here today to follow-up.  We last saw him about a few weeks ago due to diarrhea.  Concern for side effects from metformin and he was told to stop this.  He has been off metformin for the last few weeks.  Diarrhea has improved significantly.  He is concerned about management for his hyperglycemia.       Objective:  Physical Exam: BP 133/76   Pulse 64   Temp (!) 97.3 F (36.3 C) (Temporal)   Ht 5\' 8"  (1.727 m)   Wt 207 lb 3.2 oz (94 kg)   SpO2 98%   BMI 31.50 kg/m   Gen: No acute distress, resting comfortably Neuro: Grossly normal, moves all extremities Psych: Normal affect and thought content      Harold Moore M. Jerline Pain, MD 10/08/2022 9:50 AM

## 2022-10-08 NOTE — Patient Instructions (Signed)
It was very nice to see you today!  We will stay off the metformin.  Please start the [facility].  Send me a message in a few weeks to let me know how this is working.  Please come back in 3 months to recheck your A1c.  Take care, Dr Jerline Pain  PLEASE NOTE:  If you had any lab tests, please let us know if you have not heard back within a few days. You may see your results on mychart before we have a chance to review them but we will give you a call once they are reviewed by [place].   If we ordered any referrals today, please let us know if you have not heard from their office within the next week.   If you had any urgent prescriptions sent in today, please check with the pharmacy within an hour of our visit to make sure the prescription was transmitted appropriately.   Please try these tips to maintain a healthy lifestyle:  Eat at least 3 REAL meals and 1-2 snacks per day.  Aim for no more than 5 hours between eating.  If you eat breakfast, please do so within one hour of getting up.   Each meal should contain half fruits/vegetables, one quarter protein, and one quarter carbs (no bigger than a computer mouse)  Cut down on sweet beverages. This includes juice, soda, and sweet tea.   Drink at least 1 glass of water with each meal and aim for at least 8 glasses per day  Exercise at least 150 minutes every week.

## 2022-10-11 ENCOUNTER — Other Ambulatory Visit: Payer: Self-pay | Admitting: *Deleted

## 2022-10-11 MED ORDER — TIRZEPATIDE 2.5 MG/0.5ML ~~LOC~~ SOAJ: 2.5000 mg | SUBCUTANEOUS | 0 refills | Status: DC

## 2022-10-11 NOTE — Telephone Encounter (Signed)
Rx send to [facility]

## 2022-10-15 IMAGING — CT CT ANGIO CHEST
2 of 6 series · 13 of 36 positions shown · IV contrast (iopamidol)
Comparison: None available

CLINICAL DATA: Ascending aortic enlargement seen on echo

EXAM:
CT ANGIOGRAPHY CHEST WITH CONTRAST
TECHNIQUE: Multidetector CT imaging of the chest was performed using the
standard protocol during bolus administration of intravenous
contrast. Multiplanar CT image reconstructions and MIPs were
obtained to evaluate the vascular anatomy.
CONTRAST:  75mL [id number] IOPAMIDOL ([id number]) INJECTION 76%

[Series 5: cta thorax 2.00 bv36 s3 axial arterial · axial · arterial · 0.68mm/px · z∈[+1668,+1952]mm · 12 of 168 slices shown]
[im 13/168  lung]
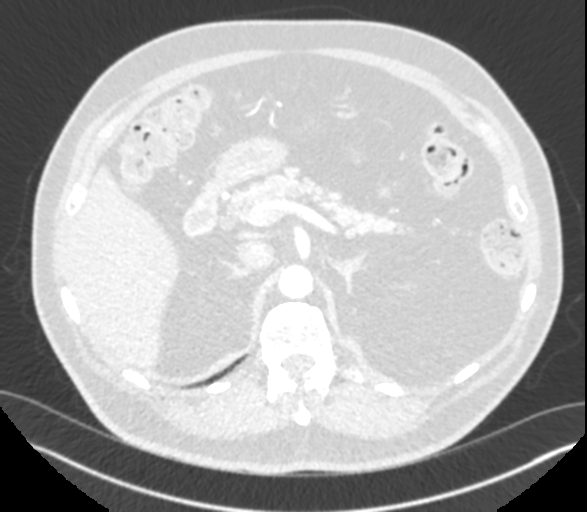
[im 26/168  mediastinal]
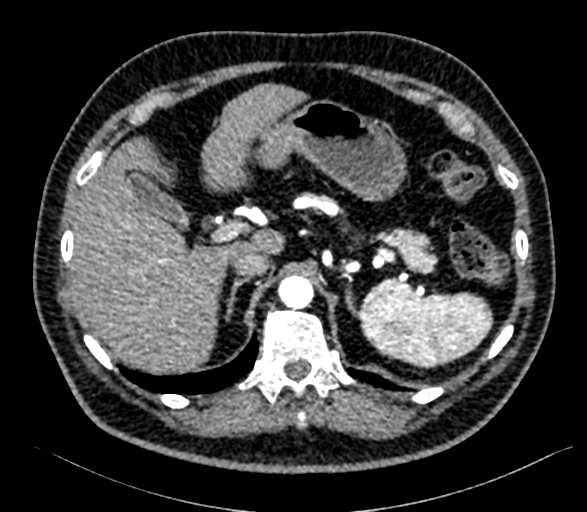
[im 39/168  lung]
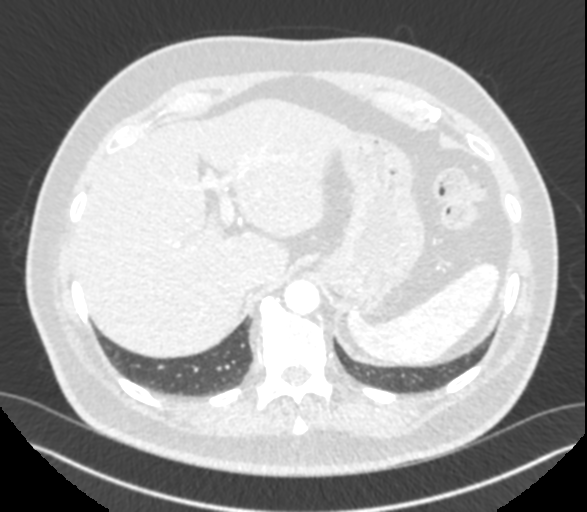
[im 52/168  mediastinal]
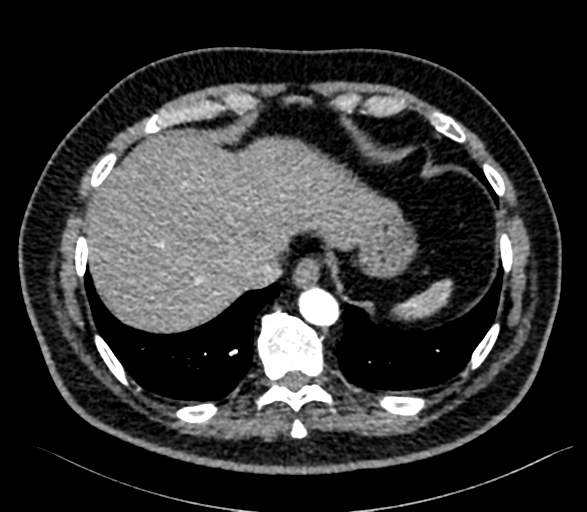
[im 65/168  lung]
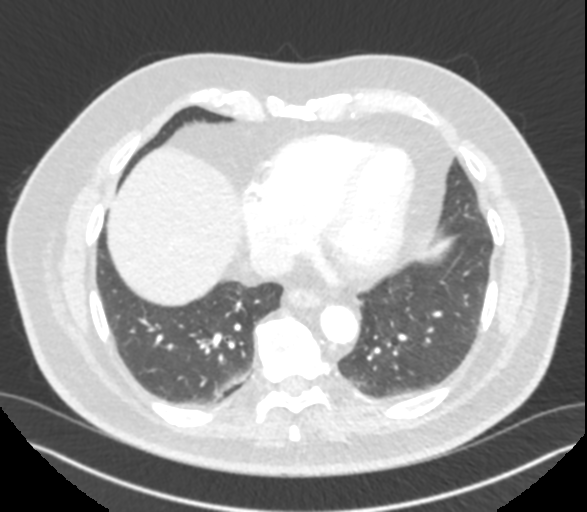
[im 78/168  mediastinal]
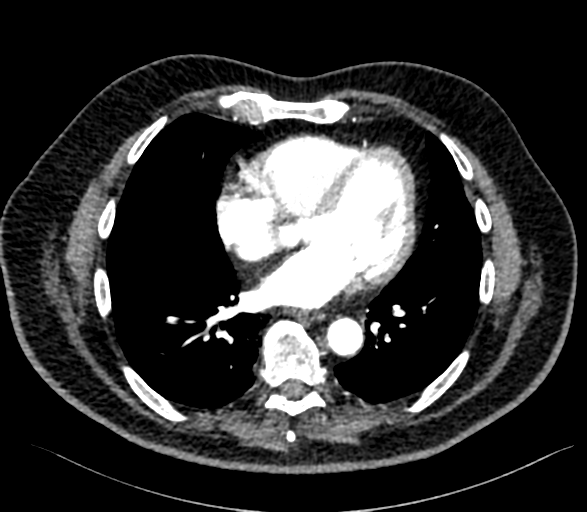
[im 90/168  lung]
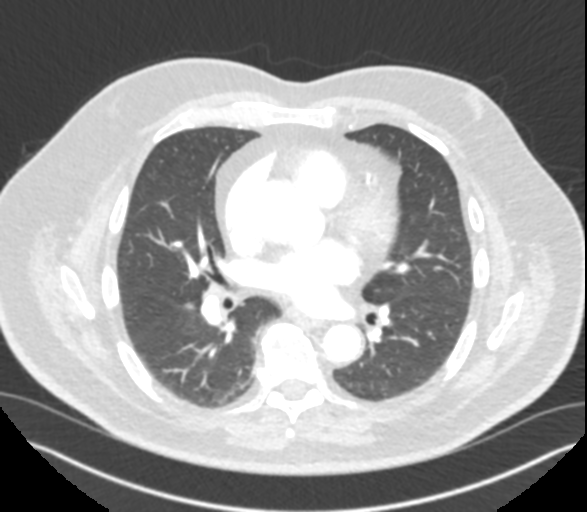
[im 103/168  mediastinal]
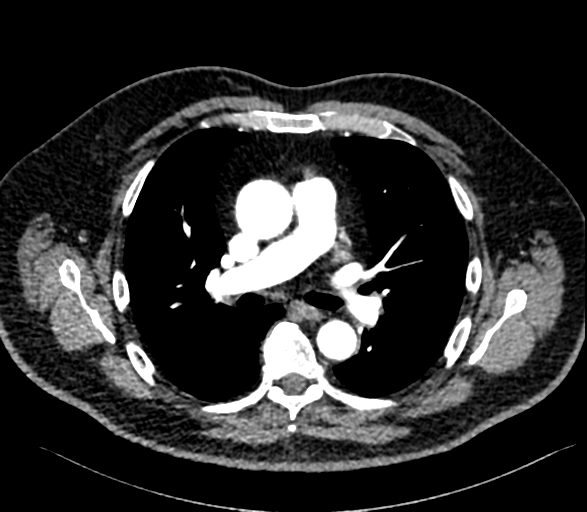
[im 116/168  lung]
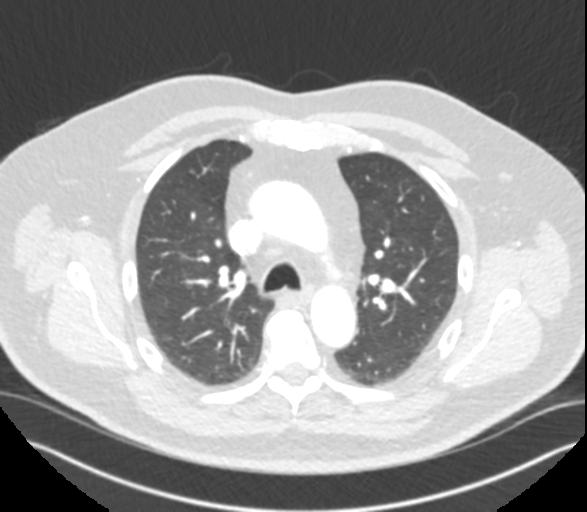
[im 129/168  mediastinal]
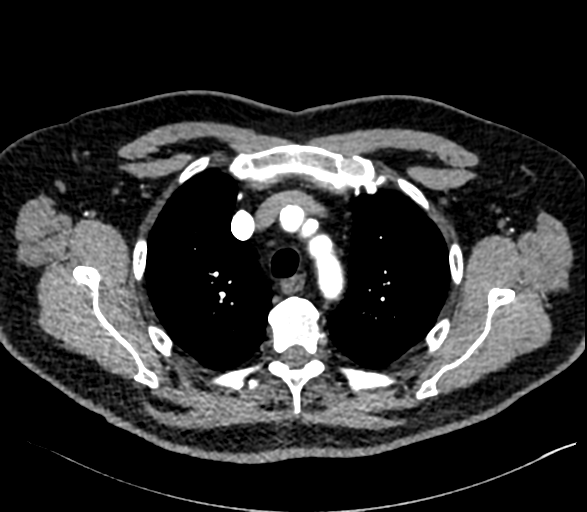
[im 142/168  lung]
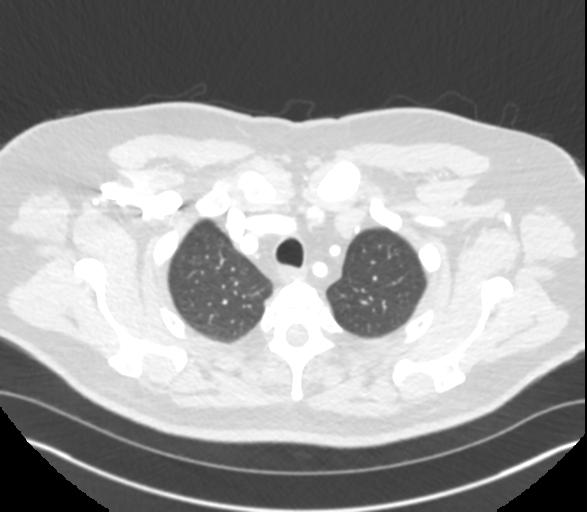
[im 155/168  mediastinal]
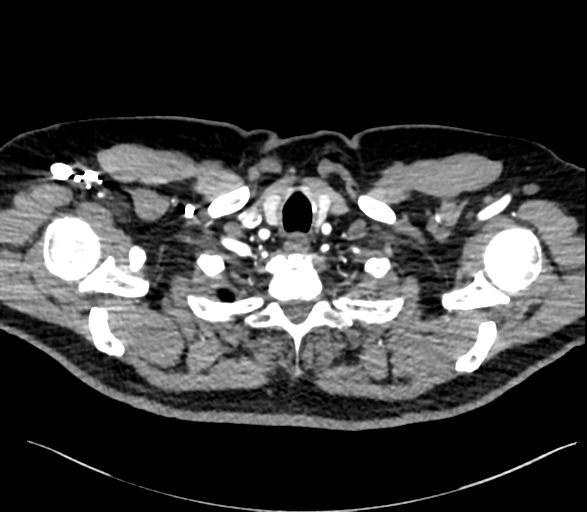

[Series 10: cta thorax 2.00 bv36 s3 cor st · coronal · 0.66mm/px · 1 of 169 slices shown]
[im 85/169  mediastinal]
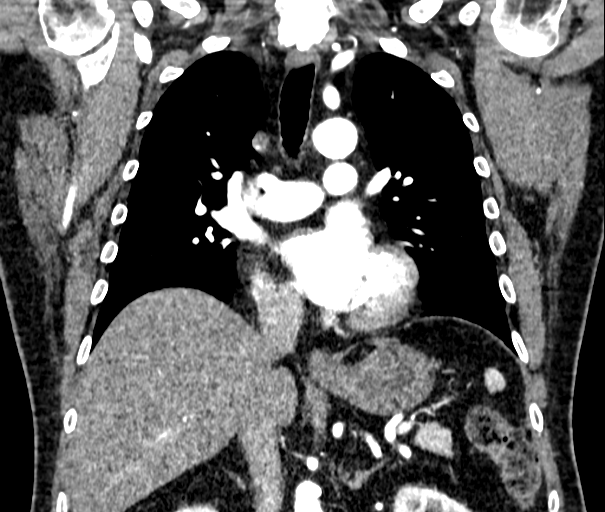

[13 of 36 positions shown; findings below may reference images not displayed]

FINDINGS: Cardiovascular: Preferential opacification of the thoracic aorta.
The ascending thoracic aorta measures up to 3.9 x 3.8 cm. The
remainder of thoracic aorta is normal in caliber. Normal heart size.
No pericardial effusion.

Mediastinum/Nodes: No enlarged mediastinal, hilar, or axillary lymph
nodes. The thyroid gland appears normal.

Lungs/Pleura: No pleural effusion. No pneumothorax. No mass or focal
consolidation. No suspicious pulmonary nodules.

Musculoskeletal: No aggressive osseous lesions.

Upper abdomen: The visualized upper abdomen is unremarkable.

Review of the MIP images confirms the above findings.
IMPRESSION: The ascending thoracic aorta measures up to 3.9 x 3.8 cm. Recommend
annual imaging followup by CTA or MRA. This recommendation follows
4797 ACCF/AHA/AATS/ACR/ASA/SCA/GARDELINA DHE/EGE CAN/MDSAGOR/CHUN LING Guidelines for the
Diagnosis and Management of Patients with Thoracic Aortic Disease.
Circulation.4797; 121: [id number]. Aortic aneurysm NOS ([id number]-18C.O)

## 2022-10-22 ENCOUNTER — Other Ambulatory Visit: Payer: Self-pay | Admitting: Family Medicine

## 2022-10-25 ENCOUNTER — Other Ambulatory Visit: Payer: Self-pay | Admitting: Family Medicine

## 2022-10-27 ENCOUNTER — Ambulatory Visit
Admission: EM | Admit: 2022-10-27 | Discharge: 2022-10-27 | Disposition: A | Discharge status: Home / Self Care | Payer: Medicare Other | Attending: Urgent Care | Admitting: Urgent Care

## 2022-10-27 DIAGNOSIS — R1030 Lower abdominal pain, unspecified: Secondary | ICD-10-CM | POA: Diagnosis not present

## 2022-10-27 DIAGNOSIS — K5900 Constipation, unspecified: Secondary | ICD-10-CM | POA: Diagnosis not present

## 2022-10-27 MED ORDER — FLEET ENEMA 7-19 GM/118ML RE ENEM: 1.0000 | ENEMA | Freq: Every day | RECTAL | 0 refills | Status: AC | PRN

## 2022-10-27 MED ORDER — POLYETHYLENE GLYCOL 3350 17 G PO PACK: 17.0000 g | PACK | Freq: Every day | ORAL | 0 refills | Status: AC | PRN

## 2022-10-27 NOTE — ED Triage Notes (Signed)
Pt c/o constipation-reports last normal BM 2 days ago-c/o RLQ pain x 2 hours-denies n/v-NAD-steady gait

## 2022-10-27 NOTE — ED Provider Notes (Signed)
[facility]  Note:  This document was prepared using Systems analyst and may include unintentional dictation errors.  MRN: [id number] DOB: [birth year]  Subjective:   Harold Moore is a 77 y.o. male presenting for 2-day history of persistent pelvic discomfort, pelvic fullness, constipation.  Has not had a bowel movement in 2 to 3 days.  Has had intermittent issues with this.  They took a suppository and did not help.  Feels like he is dramatically worse today.  Is not passing gas.  Denies fever, nausea, vomiting.  Denies history of diverticulitis, bowel obstruction.  No current facility-administered medications for this encounter.  Current Outpatient Medications:    amLODipine (NORVASC) 10 MG tablet, TAKE 1 TABLET BY MOUTH DAILY, Disp: 90 tablet, Rfl: 3   aspirin EC 81 MG tablet, Take 81 mg by mouth daily., Disp: , Rfl:    cetirizine (ZYRTEC) 10 MG tablet, Take 10 mg by mouth daily., Disp: , Rfl:    chlorthalidone (HYGROTON) 25 MG tablet, Take 1 tablet (25 mg total) by mouth daily., Disp: 90 tablet, Rfl: 1   gabapentin (NEURONTIN) 100 MG capsule, Take 1 capsule (100 mg total) by mouth at bedtime., Disp: 30 capsule, Rfl: 3   lisinopril (ZESTRIL) 20 MG tablet, Take 1 tablet (20 mg total) by mouth daily., Disp: 90 tablet, Rfl: 1   metoprolol succinate (TOPROL-XL) 50 MG 24 hr tablet, Take 1 tablet (50 mg total) by mouth daily. Take with or immediately following a meal., Disp: 90 tablet, Rfl: 1   Probiotic Product (PROBIOTIC PO), Take by mouth., Disp: , Rfl:    simvastatin (ZOCOR) 10 MG tablet, Take 1 tablet (10 mg total) by mouth daily., Disp: 90 tablet, Rfl: 1   tirzepatide (MOUNJARO) 2.5 MG/0.5ML Pen, Inject 2.5 mg into the skin once a week., Disp: 2 mL, Rfl: 0   Allergies  Allergen Reactions   Codeine Anaphylaxis    Past Medical History:  Diagnosis Date   Allergy    Arthritis    Diabetes    Hyperglycemia    Hyperlipidemia    Hypertension     Migraines    Positive PPD      Past Surgical History:  Procedure Laterality Date   APPENDECTOMY     SHOULDER SURGERY     TONSILLECTOMY      Family History  Problem Relation Age of Onset   Heart disease Mother    Stroke Mother    Hyperlipidemia Mother    Hypertension Mother    Heart disease Father    Prostate cancer Father    Hearing loss Sister    Hyperlipidemia Sister    Hypertension Sister    Heart disease Sister    Thyroid cancer Maternal Grandmother    Hearing loss Sister    Hearing loss Brother    Hyperlipidemia Brother    Colon cancer Neg Hx     Social History   Tobacco Use   Smoking status: Former    Types: Cigarettes    Quit date: 03/05/1972    Years since quitting: 50.6   Smokeless tobacco: Never  Vaping Use   Vaping Use: Never used  Substance Use Topics   Alcohol use: Not Currently   Drug use: Never    ROS   Objective:   Vitals: BP 129/78 (BP Location: Right Arm)   Pulse 86   Temp 97.7 F (36.5 C) (Oral)   Resp 20   SpO2 96%   Physical Exam Constitutional:  General: He is not in acute distress.    Appearance: Normal appearance. He is well-developed and normal weight. He is not ill-appearing, toxic-appearing or diaphoretic.  HENT:     Head: Normocephalic and atraumatic.     Right Ear: External ear normal.     Left Ear: External ear normal.     Nose: Nose normal.     Mouth/Throat:     Pharynx: Oropharynx is clear.  Eyes:     General: No scleral icterus.       Right eye: No discharge.        Left eye: No discharge.     Extraocular Movements: Extraocular movements intact.  Cardiovascular:     Rate and Rhythm: Normal rate.  Pulmonary:     Effort: Pulmonary effort is normal.  Abdominal:     General: Bowel sounds are normal. There is no distension.     Palpations: Abdomen is soft. There is no mass.     Tenderness: There is abdominal tenderness. There is no right CVA tenderness, left CVA tenderness, guarding or rebound.   Musculoskeletal:     Cervical back: Normal range of motion.  Neurological:     Mental Status: He is alert and oriented to person, place, and time.  Psychiatric:        Mood and Affect: Mood normal.        Behavior: Behavior normal.        Thought Content: Thought content normal.        Judgment: Judgment normal.     Assessment and Plan :   PDMP not reviewed this encounter.  1. Constipation, unspecified constipation type   2. Lower abdominal pain     Unfortunately we had a call out at [facility] urgent care and therefore our radiologist urologist was redirected to the outside.  I discussed this with patient and ultimately he did not want to pursue an outpatient x-ray to rule out bowel obstruction.  Discussed this as a distinct possibility but does not want to go to the emergency room either at this time.  I advised that he needs to try an enema and if that is not providing him relief, leading to a bowel movement can try MiraLAX.  Thereafter was present to the emergency room for further evaluation to rule out bowel obstruction. Counseled patient on potential for adverse effects with medications prescribed/recommended today, ER and return-to-clinic precautions discussed, patient verbalized understanding.    Jaynee Eagles, PA-C 10/27/22 1426

## 2022-11-01 ENCOUNTER — Telehealth: Payer: Self-pay | Admitting: *Deleted

## 2022-11-01 NOTE — Telephone Encounter (Signed)
Request Reference Number: [id number]. MOUNJARO INJ 2.5/0.5 is approved through 07/26/2023.  Left voice message with information to patient  Pharmacy notified

## 2022-11-01 NOTE — Telephone Encounter (Signed)
(  Harold Moore - [id number] Mounjaro 2.5MG /0.5ML pen-injectors Status: PA RequestCreated: April 8th, 2024 [id number]: April 8th, 2024 Waiting for determination

## 2022-11-11 ENCOUNTER — Other Ambulatory Visit: Payer: Self-pay | Admitting: Family Medicine

## 2023-01-28 ENCOUNTER — Other Ambulatory Visit: Payer: Self-pay | Admitting: Pharmacist

## 2023-01-28 ENCOUNTER — Other Ambulatory Visit (HOSPITAL_BASED_OUTPATIENT_CLINIC_OR_DEPARTMENT_OTHER): Payer: Self-pay

## 2023-01-28 NOTE — Progress Notes (Signed)
Pharmacy Quality Measure Review  This patient is appearing on a report for being at risk of failing the adherence measure for diabetes medications this calendar year.   Medication: Mounjaro 2.5mg  Last fill date: 10/11/2022 for 28 day supply  Medication: Metformin 1000mg  Last fill date: 08/19/2022 for 90 day supply  Metformin was discontinued by Dr Jimmey Ralph when Greggory Keen was started 10/11/2022.  It looks like patient indicated that he was having difficulty getting Mounjaro 2.5mg  from [facility] and also needed prior authorization.  Prior authorization has been approved. Called patient and he has not refill Mounjaro but he would like to have it refilled if possible. He has moved to [facility] and has appointment with new PCP later in July.  Called [facility] and they are sending out 28 DS of Mounjaro 2.5mg  - weekly. This should last until patient established with new provider. Recommend titrating Mounjaro over that next few months as tolerate by patient and if supply if available.   Henrene Pastor, PharmD Clinical Pharmacist [facility]  [facility]

## 2023-02-03 ENCOUNTER — Other Ambulatory Visit: Payer: Self-pay | Admitting: Family Medicine

## 2023-12-27 ENCOUNTER — Other Ambulatory Visit: Payer: Self-pay | Admitting: *Deleted

## 2023-12-27 DIAGNOSIS — R9430 Abnormal result of cardiovascular function study, unspecified: Secondary | ICD-10-CM
